# Patient Record
Sex: Female | Born: 1975 | Race: Black or African American | Hispanic: No | Marital: Married | Smoking: Former smoker
Health system: Southern US, Community
[De-identification: ages and names within clinical notes are randomized; demographics above are authoritative.]

## PROBLEM LIST (undated history)

## (undated) DIAGNOSIS — F329 Major depressive disorder, single episode, unspecified: Secondary | ICD-10-CM

## (undated) DIAGNOSIS — D649 Anemia, unspecified: Secondary | ICD-10-CM

## (undated) DIAGNOSIS — F32A Depression, unspecified: Secondary | ICD-10-CM

## (undated) DIAGNOSIS — F419 Anxiety disorder, unspecified: Secondary | ICD-10-CM

## (undated) HISTORY — PX: WISDOM TOOTH EXTRACTION: SHX21

## (undated) HISTORY — PX: FEMUR FRACTURE SURGERY: SHX633

## (undated) HISTORY — PX: ORIF TIBIA & FIBULA FRACTURES: SHX2131

---

## 1999-07-31 ENCOUNTER — Other Ambulatory Visit: Admission: RE | Admit: 1999-07-31 | Discharge: 1999-07-31 | Payer: Self-pay | Admitting: Obstetrics and Gynecology

## 2000-05-15 ENCOUNTER — Ambulatory Visit (HOSPITAL_COMMUNITY): Admission: RE | Admit: 2000-05-15 | Discharge: 2000-05-15 | Payer: Self-pay | Admitting: Obstetrics and Gynecology

## 2000-05-15 ENCOUNTER — Encounter: Payer: Self-pay | Admitting: Obstetrics and Gynecology

## 2000-10-21 ENCOUNTER — Encounter: Payer: Self-pay | Admitting: Obstetrics and Gynecology

## 2000-10-21 ENCOUNTER — Inpatient Hospital Stay (HOSPITAL_COMMUNITY): Admission: RE | Admit: 2000-10-21 | Discharge: 2000-10-26 | Payer: Self-pay | Admitting: Obstetrics and Gynecology

## 2000-10-21 ENCOUNTER — Encounter (INDEPENDENT_AMBULATORY_CARE_PROVIDER_SITE_OTHER): Payer: Self-pay | Admitting: Specialist

## 2000-10-27 ENCOUNTER — Encounter: Admission: RE | Admit: 2000-10-27 | Discharge: 2000-11-26 | Payer: Self-pay | Admitting: Obstetrics and Gynecology

## 2000-12-04 ENCOUNTER — Other Ambulatory Visit: Admission: RE | Admit: 2000-12-04 | Discharge: 2000-12-04 | Payer: Self-pay | Admitting: Obstetrics and Gynecology

## 2002-07-28 HISTORY — PX: ORIF TIBIA & FIBULA FRACTURES: SHX2131

## 2002-07-28 HISTORY — PX: FEMUR FRACTURE SURGERY: SHX633

## 2002-12-06 ENCOUNTER — Other Ambulatory Visit: Admission: RE | Admit: 2002-12-06 | Discharge: 2002-12-06 | Payer: Self-pay | Admitting: Obstetrics and Gynecology

## 2004-03-25 ENCOUNTER — Other Ambulatory Visit: Admission: RE | Admit: 2004-03-25 | Discharge: 2004-03-25 | Payer: Self-pay | Admitting: Obstetrics and Gynecology

## 2005-09-05 ENCOUNTER — Other Ambulatory Visit: Admission: RE | Admit: 2005-09-05 | Discharge: 2005-09-05 | Payer: Self-pay | Admitting: Obstetrics and Gynecology

## 2005-09-20 ENCOUNTER — Emergency Department (HOSPITAL_COMMUNITY): Admission: EM | Admit: 2005-09-20 | Discharge: 2005-09-21 | Payer: Self-pay | Admitting: Emergency Medicine

## 2005-09-26 ENCOUNTER — Ambulatory Visit: Payer: Self-pay | Admitting: Gastroenterology

## 2009-08-08 ENCOUNTER — Emergency Department (HOSPITAL_COMMUNITY): Admission: EM | Admit: 2009-08-08 | Discharge: 2009-08-09 | Payer: Self-pay | Admitting: Emergency Medicine

## 2010-05-17 ENCOUNTER — Emergency Department (HOSPITAL_COMMUNITY): Admission: EM | Admit: 2010-05-17 | Discharge: 2010-05-18 | Payer: Self-pay | Admitting: Emergency Medicine

## 2010-10-13 LAB — URINE MICROSCOPIC-ADD ON

## 2010-10-13 LAB — URINALYSIS, ROUTINE W REFLEX MICROSCOPIC
Bilirubin Urine: NEGATIVE
Glucose, UA: NEGATIVE mg/dL
Ketones, ur: NEGATIVE mg/dL
Leukocytes, UA: NEGATIVE
Nitrite: NEGATIVE
Protein, ur: NEGATIVE mg/dL
Specific Gravity, Urine: 1.031 — ABNORMAL HIGH (ref 1.005–1.030)
Urobilinogen, UA: 1 mg/dL (ref 0.0–1.0)
pH: 6 (ref 5.0–8.0)

## 2010-10-13 LAB — PREGNANCY, URINE: Preg Test, Ur: NEGATIVE

## 2010-12-13 NOTE — Discharge Summary (Signed)
[facility]  Patient:    Kelly Joyce, Kelly Joyce                      MRN: [id number] Adm. Date:  [id number] Disc. Date: 10/26/00 Attending:  Michaele Offer                           Discharge Summary  ADMISSION DIAGNOSES:          1. Intrauterine pregnancy at 41 weeks.                               2. Oligohydramnios.  DIAGNOSIS DIAGNOSES:          1. Intrauterine pregnancy at 41 weeks.                               2. Oligohydramnios.                               3. Thick meconium stained amniotic fluid.                               4. Nonreassuring fetal heart tracing.  PROCEDURE:                    Primary low transverse cesarean section without                               extensions.  COMPLICATIONS:                None.  CONSULTATIONS:                None.  HISTORY OF PRESENT ILLNESS:   This is a 35 year old black female gravida 3, para 1-0-1-1 with an EGA of [redacted] weeks by a 10-week ultrasound with an EDC of October 14, 2000, who was seen on the day of admission for testing due to going past her due date.  Ultrasound revealed an AFI of 1.7 and she was admitted for induction with an unfavorable cervix. Prenatal care was otherwise uncomplicated.  PRENATAL LABS:                Blood type is O positive with a negative antibody screen.  Sickle screen is negative.  RPR nonreactive.  Rubella immune.  Hepatitis B surface antigen negative.  HIV negative.  Gonorrhea and chlamydia negative. Triple screen normal. One hour Glucola was 79 and group B strep is negative.  PAST OB HISTORY:              In 1994, she had a vaginal delivery at term; 7 pounds 1 ounce.  In 1996, she had a spontaneous abortion.  The remainder of her history is noncontributory.  PHYSICAL EXAMINATION:  VITAL SIGNS:                  She was afebrile with stable vital signs.  ABDOMEN:                      Soft with a fundal height of 39 cm.  Fetal heart tracing was normal and  she was contracting every two to  four minutes.  PELVIC:                       Cervix was 160, -1, with vertex presentation and an adequate pelvis.  HOSPITAL COURSE:              The patient was admitted due to the oligohydramnios found on ultrasound and her contractions spaced out.  Thus, Malachi Pro. Ambrose Mantle, M.D., placed some Prostin gel for cervical ripening due to her unfavorable cervix.  Early on the morning of October 22, 2000, she was then started on Pitocin induction.  At that time fetal heart tracing was reassuring with a few variable decelerations and she was contracting every three to five minutes on Pitocin.  At approximately noon on October 22, 2000, the fetal heart tracing developed frequent variable decelerations, some of which were prolonged and there was slightly decreased variability.  Vaginal examination at that time was 6, 90, and 0 with a vertex presentation and an adequate pelvis. Artificial rupture of membranes at that time revealed thick dark stained meconium.  An  IUPC and FSC were placed and an amnioinfusion was begun.  Fetal heart tracing then developed possible late decelerations with decreased variability and no significant accelerations.  She has also had prolonged decelerations with contractions and we were unable to restart the Pitocin.  Her cervical examination also did not change over approximately an hour and a half.  At that time, it was recommended that she have a cesarean section and the patient agreed.  She was taken to the operating room and had a primary low transverse cesarean section without extensions performed under epidural anesthesia.  Estimated blood loss was 1000 cc and the patient had normal anatomy.  She delivered a viable female infant with Apgars of 7 and 9 that weighed 8 pounds 4 ounces.  Postoperatively, the patient did very well. She remained essentially afebrile and was rapidly able to ambulate and tolerate a regular diet.  Predelivery  hemoglobin 11.5, post delivery was 10.0. On the morning of postoperative day #4, she again was afebrile and her incision was healing well.  At this time her staples were removed and Steri-Strips were applied.  She was felt to be stable enough for discharge home at this time.  CONDITION ON DISCHARGE:       Stable.  DISPOSITION:                  Discharged to home.  DISCHARGE INSTRUCTIONS: 1. Her diet is a regular diet. 2. Activity is pelvic rest, no strenuous activity and no driving. 3. Follow-up is in 10 days for an incision check. 4. Medication is Percocet p.r.n. pain. 5. She is given a discharge pamphlet. DD:  10/26/00 TD:  10/26/00 Job: [id number]

## 2010-12-13 NOTE — Op Note (Signed)
[facility]  Patient:    Kelly Joyce, Kelly Joyce                      MRN: [id number] Proc. Date: 10/22/00 Adm. Date:  [id number] Attending:  Michaele Offer                           Operative Report  PREOPERATIVE DIAGNOSES:       1. Intrauterine pregnancy at 41 weeks.                               2. Oligohydramnios.                               3. Thick, meconium-stained amniotic fluid.                               4. Nonreassuring fetal heart tracing.  POSTOPERATIVE DIAGNOSES:      1. Intrauterine pregnancy at 41 weeks.                               2. Oligohydramnios.                               3. Thick, meconium-stained amniotic fluid.                               4. Nonreassuring fetal heart tracing.  OPERATION:                    Primary low transverse cesarean section                               without extensions.  SURGEON:                      Zenaida Niece, M.D.  ANESTHESIA:                   Epidural.  ESTIMATED BLOOD LOSS:         1000 cc.  DRUGS ADMINISTERED:           Chemotherapy prophylaxis, Ancef 1 g after cord clamp.  FINDINGS:                     Normal female anatomy and delivery of a viable female infant with Apgars of 7/9, weight 8 pounds 4 ounces.  COUNTS:                       Correct.  CONDITION:                    Stable.  PROCEDURE IN DETAIL:          After appropriate informed consent was obtained, the patient was taken to the operating room and placed in the dorsosupine position with left lateral tilt.  Her previously placed epidural was dosed appropriately.  Her abdomen was prepped and draped in the usual sterile fashion.  The level of her anesthesia was  found to be adequate, and her abdomen was entered via a standard Pfannenstiel incision.  The vesicouterine peritoneum was incised and a bladder flap created digitally.  The bladder blade was placed to retract the bladder, and a 4 cm transverse  incision was made in the lower uterine segment.  Moderate meconium fluid was noted upon entering the amniotic cavity, and the uterine incision was extended bilaterally digitally.  The fetal vertex was grasped and delivered through the incision atraumatically.  Nuchal cord x 1 was reduced.  The mouth and nares were DeLee suctioned with the return of a fair amount of moderate meconium. The remainder of the infant was then delivered atraumatically.  The cord was doubly clamped and cut and the infant handed to the awaiting pediatric team. Cord blood and a segment of cord for gas were obtained and the placenta delivered spontaneously.  The uterus was wiped dry with a clean lap pad and palpated normally.  The uterine incision was inspected and found to be free of extensions.  It was then closed in a running locking layer with #1 chromic with adequate hemostasis.  Both tubes and ovaries were inspected and found to be normal.  The uterine incision was again inspected and found to be hemostatic.  The subfascial space was irrigated and made hemostatic with electrocautery.  The fascia was closed in a running fashion starting at both ends and meeting in the middle with 0 Vicryl.  The subcutaneous tissue was irrigated and made hemostatic with electrocautery.  The skin was closed with staples followed by a sterile dressing.  The patient tolerated the procedure well and was taken to the recovery room in stable condition. DD:  10/22/00 TD:  10/23/00 Job: [id number]

## 2011-01-10 ENCOUNTER — Other Ambulatory Visit: Payer: Self-pay | Admitting: Allergy and Immunology

## 2011-01-10 ENCOUNTER — Ambulatory Visit
Admission: RE | Admit: 2011-01-10 | Discharge: 2011-01-10 | Disposition: A | Discharge status: Home / Self Care | Payer: BC Managed Care – PPO | Source: Ambulatory Visit | Attending: Allergy and Immunology | Admitting: Allergy and Immunology

## 2011-01-10 DIAGNOSIS — R0781 Pleurodynia: Secondary | ICD-10-CM

## 2011-02-25 ENCOUNTER — Other Ambulatory Visit: Payer: Self-pay | Admitting: Family Medicine

## 2011-02-25 ENCOUNTER — Other Ambulatory Visit (HOSPITAL_COMMUNITY)
Admission: RE | Admit: 2011-02-25 | Discharge: 2011-02-25 | Disposition: A | Discharge status: Home / Self Care | Payer: BC Managed Care – PPO | Source: Ambulatory Visit | Attending: Family Medicine | Admitting: Family Medicine

## 2011-02-25 DIAGNOSIS — Z124 Encounter for screening for malignant neoplasm of cervix: Secondary | ICD-10-CM | POA: Insufficient documentation

## 2011-06-04 ENCOUNTER — Telehealth: Payer: Self-pay | Admitting: Oncology

## 2011-06-04 NOTE — Telephone Encounter (Signed)
lmonvm for pt to call me re appt w/DM.

## 2011-06-06 ENCOUNTER — Telehealth: Payer: Self-pay | Admitting: Oncology

## 2011-06-06 NOTE — Telephone Encounter (Signed)
gv pt appt for 11/20 @ 2 pm w/DM.

## 2011-06-16 ENCOUNTER — Other Ambulatory Visit: Payer: Self-pay | Admitting: Oncology

## 2011-06-16 ENCOUNTER — Encounter (HOSPITAL_COMMUNITY): Payer: Self-pay | Admitting: *Deleted

## 2011-06-16 DIAGNOSIS — D509 Iron deficiency anemia, unspecified: Secondary | ICD-10-CM | POA: Insufficient documentation

## 2011-06-17 ENCOUNTER — Other Ambulatory Visit: Payer: Self-pay | Admitting: Oncology

## 2011-06-17 ENCOUNTER — Other Ambulatory Visit: Payer: Self-pay | Admitting: *Deleted

## 2011-06-17 ENCOUNTER — Ambulatory Visit (HOSPITAL_BASED_OUTPATIENT_CLINIC_OR_DEPARTMENT_OTHER): Payer: BC Managed Care – PPO | Admitting: Oncology

## 2011-06-17 ENCOUNTER — Ambulatory Visit: Payer: BC Managed Care – PPO

## 2011-06-17 ENCOUNTER — Other Ambulatory Visit (HOSPITAL_BASED_OUTPATIENT_CLINIC_OR_DEPARTMENT_OTHER): Payer: BC Managed Care – PPO | Admitting: Lab

## 2011-06-17 VITALS — BP 121/85 | HR 79 | Temp 98.5°F | Ht 65.0 in | Wt 141.7 lb

## 2011-06-17 DIAGNOSIS — D709 Neutropenia, unspecified: Secondary | ICD-10-CM

## 2011-06-17 DIAGNOSIS — D509 Iron deficiency anemia, unspecified: Secondary | ICD-10-CM

## 2011-06-17 DIAGNOSIS — D72819 Decreased white blood cell count, unspecified: Secondary | ICD-10-CM

## 2011-06-17 DIAGNOSIS — F17200 Nicotine dependence, unspecified, uncomplicated: Secondary | ICD-10-CM

## 2011-06-17 LAB — CBC & DIFF AND RETIC
Basophils Absolute: 0 10*3/uL (ref 0.0–0.1)
Eosinophils Absolute: 0.1 10*3/uL (ref 0.0–0.5)
HCT: 36.1 % (ref 34.8–46.6)
HGB: 11.9 g/dL (ref 11.6–15.9)
LYMPH%: 43.6 % (ref 14.0–49.7)
MCH: 28.7 pg (ref 25.1–34.0)
MCV: 87 fL (ref 79.5–101.0)
MONO%: 11 % (ref 0.0–14.0)
NEUT#: 1.3 10*3/uL — ABNORMAL LOW (ref 1.5–6.5)
NEUT%: 40.8 % (ref 38.4–76.8)
Platelets: 268 10*3/uL (ref 145–400)
RDW: 14.7 % — ABNORMAL HIGH (ref 11.2–14.5)
Retic Ct Abs: 42.75 10*3/uL (ref 33.70–90.70)

## 2011-06-17 LAB — CHCC SMEAR

## 2011-06-17 LAB — COMPREHENSIVE METABOLIC PANEL
AST: 13 U/L (ref 0–37)
Albumin: 4.4 g/dL (ref 3.5–5.2)
Alkaline Phosphatase: 54 U/L (ref 39–117)
BUN: 10 mg/dL (ref 6–23)
Glucose, Bld: 81 mg/dL (ref 70–99)
Potassium: 4.1 mEq/L (ref 3.5–5.3)
Sodium: 139 mEq/L (ref 135–145)
Total Bilirubin: 0.5 mg/dL (ref 0.3–1.2)

## 2011-06-17 LAB — IRON AND TIBC
%SAT: 23 % (ref 20–55)
Iron: 91 ug/dL (ref 42–145)
TIBC: 390 ug/dL (ref 250–470)
UIBC: 299 ug/dL (ref 125–400)

## 2011-06-17 LAB — FERRITIN: Ferritin: 18 ng/mL (ref 10–291)
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
## 2011-06-17 NOTE — Progress Notes (Signed)
This office note has been dictated.  #[id number]

## 2011-06-24 ENCOUNTER — Encounter (HOSPITAL_COMMUNITY): Payer: Self-pay | Admitting: Pharmacist
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
## 2011-07-07 ENCOUNTER — Encounter (HOSPITAL_COMMUNITY): Payer: Self-pay | Admitting: Anesthesiology

## 2011-07-07 ENCOUNTER — Encounter (HOSPITAL_COMMUNITY): Payer: Self-pay | Admitting: *Deleted

## 2011-07-07 ENCOUNTER — Ambulatory Visit (HOSPITAL_COMMUNITY): Payer: BC Managed Care – PPO | Admitting: Anesthesiology

## 2011-07-07 ENCOUNTER — Encounter (HOSPITAL_COMMUNITY)
Admission: RE | Disposition: A | Discharge status: Home / Self Care | Payer: Self-pay | Source: Ambulatory Visit | Attending: Obstetrics and Gynecology

## 2011-07-07 ENCOUNTER — Ambulatory Visit (HOSPITAL_COMMUNITY)
Admission: RE | Admit: 2011-07-07 | Discharge: 2011-07-07 | Disposition: A | Discharge status: Home / Self Care | Payer: BC Managed Care – PPO | Source: Ambulatory Visit | Attending: Obstetrics and Gynecology | Admitting: Obstetrics and Gynecology

## 2011-07-07 DIAGNOSIS — N920 Excessive and frequent menstruation with regular cycle: Secondary | ICD-10-CM | POA: Insufficient documentation

## 2011-07-07 DIAGNOSIS — D250 Submucous leiomyoma of uterus: Secondary | ICD-10-CM | POA: Diagnosis present

## 2011-07-07 HISTORY — DX: Anemia, unspecified: D64.9

## 2011-07-07 LAB — BASIC METABOLIC PANEL
CO2: 27 mEq/L (ref 19–32)
Calcium: 8.9 mg/dL (ref 8.4–10.5)
Creatinine, Ser: 0.66 mg/dL (ref 0.50–1.10)
GFR calc Af Amer: >90 mL/min (ref >90)
GFR calc non Af Amer: >90 mL/min (ref >90)
Sodium: 136 mEq/L (ref 135–145)

## 2011-07-07 LAB — CBC
MCH: 29 pg (ref 26.0–34.0)
MCV: 90 fL (ref 78.0–100.0)
Platelets: 256 10*3/uL (ref 150–400)
RBC: 3.89 MIL/uL (ref 3.87–5.11)
RDW: 14.3 % (ref 11.5–15.5)
WBC: 4.4 10*3/uL (ref 4.0–10.5)

## 2011-07-07 LAB — PREGNANCY, URINE: Preg Test, Ur: NEGATIVE

## 2011-07-07 SURGERY — DILATATION & CURETTAGE/HYSTEROSCOPY WITH RESECTOCOPE
Anesthesia: Choice

## 2011-07-07 MED ORDER — FENTANYL CITRATE 0.05 MG/ML IJ SOLN
INTRAMUSCULAR | Status: AC
  Filled 2011-07-07: qty 5

## 2011-07-07 MED ORDER — PROPOFOL 10 MG/ML IV EMUL
INTRAVENOUS | Status: DC | PRN
  Administered 2011-07-07: 200 mg via INTRAVENOUS

## 2011-07-07 MED ORDER — PROPOFOL 10 MG/ML IV EMUL
INTRAVENOUS | Status: AC
  Filled 2011-07-07: qty 20

## 2011-07-07 MED ORDER — ACETAMINOPHEN 325 MG PO TABS: 325.0000 mg | ORAL_TABLET | ORAL | Status: DC | PRN

## 2011-07-07 MED ORDER — MIDAZOLAM HCL 2 MG/2ML IJ SOLN
INTRAMUSCULAR | Status: AC
  Filled 2011-07-07: qty 2

## 2011-07-07 MED ORDER — LACTATED RINGERS IV SOLN
INTRAVENOUS | Status: DC
  Administered 2011-07-07: 07:00:00 via INTRAVENOUS

## 2011-07-07 MED ORDER — LIDOCAINE HCL (CARDIAC) 20 MG/ML IV SOLN
INTRAVENOUS | Status: DC | PRN
  Administered 2011-07-07: 40 mg via INTRAVENOUS

## 2011-07-07 MED ORDER — KETOROLAC TROMETHAMINE 30 MG/ML IJ SOLN
INTRAMUSCULAR | Status: DC | PRN
  Administered 2011-07-07: 30 mg via INTRAVENOUS

## 2011-07-07 MED ORDER — ONDANSETRON HCL 4 MG/2ML IJ SOLN
INTRAMUSCULAR | Status: AC
  Filled 2011-07-07: qty 2

## 2011-07-07 MED ORDER — FENTANYL CITRATE 0.05 MG/ML IJ SOLN
INTRAMUSCULAR | Status: DC | PRN
  Administered 2011-07-07: 100 ug via INTRAVENOUS
  Administered 2011-07-07: 50 ug via INTRAVENOUS

## 2011-07-07 MED ORDER — ONDANSETRON HCL 4 MG/2ML IJ SOLN
INTRAMUSCULAR | Status: DC | PRN
  Administered 2011-07-07: 4 mg via INTRAVENOUS

## 2011-07-07 MED ORDER — FENTANYL CITRATE 0.05 MG/ML IJ SOLN: 25.0000 ug | INTRAMUSCULAR | Status: DC | PRN

## 2011-07-07 MED ORDER — DEXAMETHASONE SODIUM PHOSPHATE 10 MG/ML IJ SOLN
INTRAMUSCULAR | Status: AC
  Filled 2011-07-07: qty 1

## 2011-07-07 MED ORDER — MIDAZOLAM HCL 5 MG/5ML IJ SOLN
INTRAMUSCULAR | Status: DC | PRN
  Administered 2011-07-07: 2 mg via INTRAVENOUS

## 2011-07-07 MED ORDER — PROMETHAZINE HCL 25 MG/ML IJ SOLN: 6.2500 mg | INTRAMUSCULAR | Status: DC | PRN

## 2011-07-07 MED ORDER — LIDOCAINE HCL 2 % IJ SOLN
INTRAMUSCULAR | Status: DC | PRN
  Administered 2011-07-07: 16 mL

## 2011-07-07 MED ORDER — DEXAMETHASONE SODIUM PHOSPHATE 10 MG/ML IJ SOLN
INTRAMUSCULAR | Status: DC | PRN
  Administered 2011-07-07: 10 mg via INTRAVENOUS

## 2011-07-07 MED ORDER — KETOROLAC TROMETHAMINE 30 MG/ML IJ SOLN: 15.0000 mg | Freq: Once | INTRAMUSCULAR | Status: DC | PRN

## 2011-07-07 MED ORDER — LIDOCAINE HCL (CARDIAC) 20 MG/ML IV SOLN
INTRAVENOUS | Status: AC
  Filled 2011-07-07: qty 5

## 2011-07-07 MED ORDER — KETOROLAC TROMETHAMINE 30 MG/ML IJ SOLN
INTRAMUSCULAR | Status: AC
  Filled 2011-07-07: qty 1

## 2011-07-07 SURGICAL SUPPLY — 15 items
ABLATOR ENDOMETRIAL BIPOLAR (ABLATOR) IMPLANT
CANISTER SUCTION 2500CC (MISCELLANEOUS) ×2 IMPLANT
CATH ROBINSON RED A/P 16FR (CATHETERS) ×2 IMPLANT
CLOTH BEACON ORANGE TIMEOUT ST (SAFETY) ×2 IMPLANT
CONTAINER PREFILL 10% NBF 60ML (FORM) ×4 IMPLANT
ELECT REM PT RETURN 9FT ADLT (ELECTROSURGICAL) ×2
ELECTRODE REM PT RTRN 9FT ADLT (ELECTROSURGICAL) ×1 IMPLANT
GLOVE BIO SURGEON STRL SZ8 (GLOVE) ×2 IMPLANT
GLOVE ORTHO TXT STRL SZ7.5 (GLOVE) ×2 IMPLANT
GOWN PREVENTION PLUS LG XLONG (DISPOSABLE) ×2 IMPLANT
GOWN STRL REIN XL XLG (GOWN DISPOSABLE) ×2 IMPLANT
LOOP ANGLED CUTTING 22FR (CUTTING LOOP) IMPLANT
PACK HYSTEROSCOPY LF (CUSTOM PROCEDURE TRAY) ×2 IMPLANT
TOWEL OR 17X24 6PK STRL BLUE (TOWEL DISPOSABLE) ×4 IMPLANT
WATER STERILE IRR 1000ML POUR (IV SOLUTION) ×2 IMPLANT

## 2011-07-07 NOTE — Progress Notes (Signed)
Date of Initial H&P: 07-06-11  History reviewed, patient examined, no change in status, stable for surgery.  Reviewed procedure and consent.

## 2011-07-07 NOTE — Anesthesia Preprocedure Evaluation (Signed)
Anesthesia Evaluation  Patient identified by MRN, date of birth, ID band Patient awake    Reviewed: Allergy & Precautions, H&P , Patient's Chart, lab work & pertinent test results, reviewed documented beta blocker date and time   History of Anesthesia Complications Negative for: history of anesthetic complications  Airway Mallampati: II TM Distance: >3 FB Neck ROM: full    Dental No notable dental hx.    Pulmonary neg pulmonary ROS,  clear to auscultation  Pulmonary exam normal       Cardiovascular Exercise Tolerance: Good neg cardio ROS regular Normal    Neuro/Psych Negative Neurological ROS  Negative Psych ROS   GI/Hepatic negative GI ROS, Neg liver ROS,   Endo/Other  Negative Endocrine ROS  Renal/GU negative Renal ROS     Musculoskeletal   Abdominal   Peds  Hematology negative hematology ROS (+)   Anesthesia Other Findings   Reproductive/Obstetrics negative OB ROS                           Anesthesia Physical Anesthesia Plan  ASA: I  Anesthesia Plan: General LMA   Post-op Pain Management:    Induction:   Airway Management Planned:   Additional Equipment:   Intra-op Plan:   Post-operative Plan:   Informed Consent: I have reviewed the patients History and Physical, chart, labs and discussed the procedure including the risks, benefits and alternatives for the proposed anesthesia with the patient or authorized representative who has indicated his/her understanding and acceptance.   Dental Advisory Given  Plan Discussed with: CRNA and Surgeon  Anesthesia Plan Comments:         Anesthesia Quick Evaluation

## 2011-07-07 NOTE — Op Note (Signed)
Preoperative diagnosis: Intracavitary submucosal fibroid Postop diagnosis: Same Procedure: Hysteroscopy and resection of the fibroid using the Mayeaux sure device Surgeon: Lavina Hamman M.D. Anesthesia: Gen. With an LMA, paracervical block Findings: She had a 2 cm intracavitary fibroid coming from the right anterior lateral fundus. No other endometrial lesions. Resection with the Myosure device went well without difficulty. Specimens: Endometrial resections not sent for pathology Estimated blood loss: Minimal Fluid deficit through the hysteroscope: Minimal Complications: None  Procedure in detail: The patient was taken to the operating room and placed in the dorsosupine position. General anesthesia was induced and she was placed in mobile stirrups. Perineum and vagina were then prepped and draped in the usual sterile fashion, bladder drained with a red Robinson catheter. A Graves speculum was inserted into the vagina and the anterior lip of the cervix was grasped with a single-tooth tenaculum. Paracervical block was then performed with a total of 16 cc of 2% plain lidocaine. Uterus then sounded to 10 cm. The cervix was gradually easily dilated to a size 19 dilator. The Myosure scope was inserted and good visualization was achieved. This fibroid was easily visualized and no other endometrial pathology was identified. A resection was then performed with the Myosure device without any difficulty removing the fibroid in its entirety. No significant bleeding was noted. The scope was removed. The single-tooth tenaculum was removed and bleeding was controlled with pressure. All instruments were then removed from the vagina. The patient tolerated the procedure well and was taken to the recovery in stable condition. Counts were correct and she had PAS hose on throughout the procedure.

## 2011-07-07 NOTE — Transfer of Care (Signed)
Immediate Anesthesia Transfer of Care Note  Patient: Kelly Joyce  Procedure(s) Performed:  DILATATION & CURETTAGE/HYSTEROSCOPY WITH RESECTOSCOPE - Myosure/resection of a myoma  Patient Location: PACU  Anesthesia Type: General  Level of Consciousness: awake, alert  and oriented  Airway & Oxygen Therapy: Patient Spontanous Breathing and Patient connected to nasal cannula oxygen  Post-op Assessment: Report given to PACU RN and Post -op Vital signs reviewed and stable  Post vital signs: Reviewed and stable  Complications: No apparent anesthesia complications

## 2011-07-08 NOTE — Anesthesia Postprocedure Evaluation (Signed)
  Anesthesia Post-op Note  Patient: Kelly Joyce  Procedure(s) Performed:  DILATATION & CURETTAGE/HYSTEROSCOPY WITH RESECTOSCOPE - Myosure/resection of a myoma  Patient is awake and responsive. Pain and nausea are reasonably well controlled. Vital signs are stable and clinically acceptable. Oxygen saturation is clinically acceptable. There are no apparent anesthetic complications at this time. Patient is ready for discharge.

## 2012-11-26 ENCOUNTER — Other Ambulatory Visit: Payer: Self-pay

## 2012-11-26 DIAGNOSIS — Z1231 Encounter for screening mammogram for malignant neoplasm of breast: Secondary | ICD-10-CM

## 2012-12-11 IMAGING — CR DG RIBS W/ CHEST 3+V*R*
3 series · 3 of 3 positions shown · non-contrast
Comparison: None.

CLINICAL DATA: Right posterior inferior rib pain.  Intermittent
shortness of breath.  Smoker.

RIGHT RIBS AND CHEST - 3+ VIEW

[w chest pa]
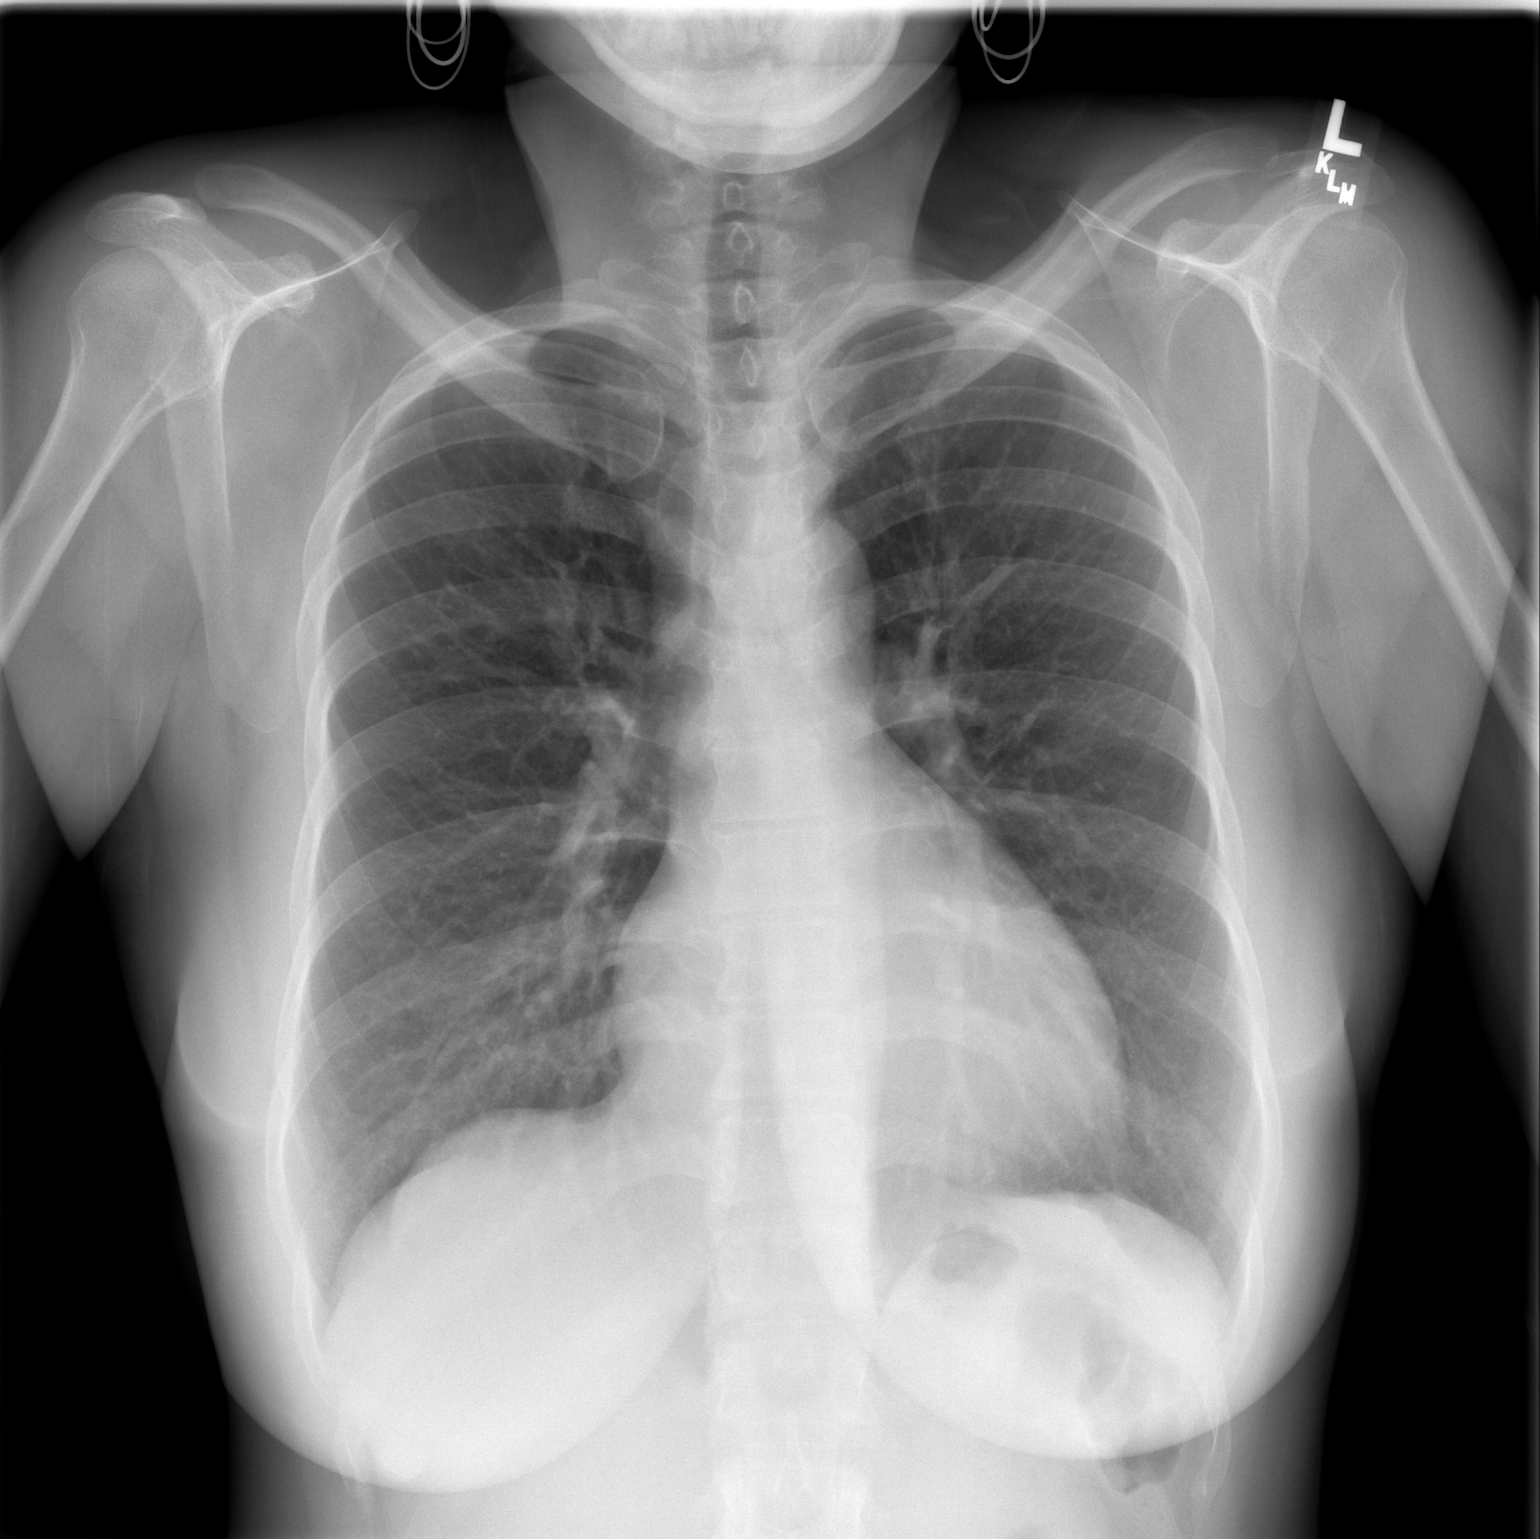

[w ribs ap/pa lower right]
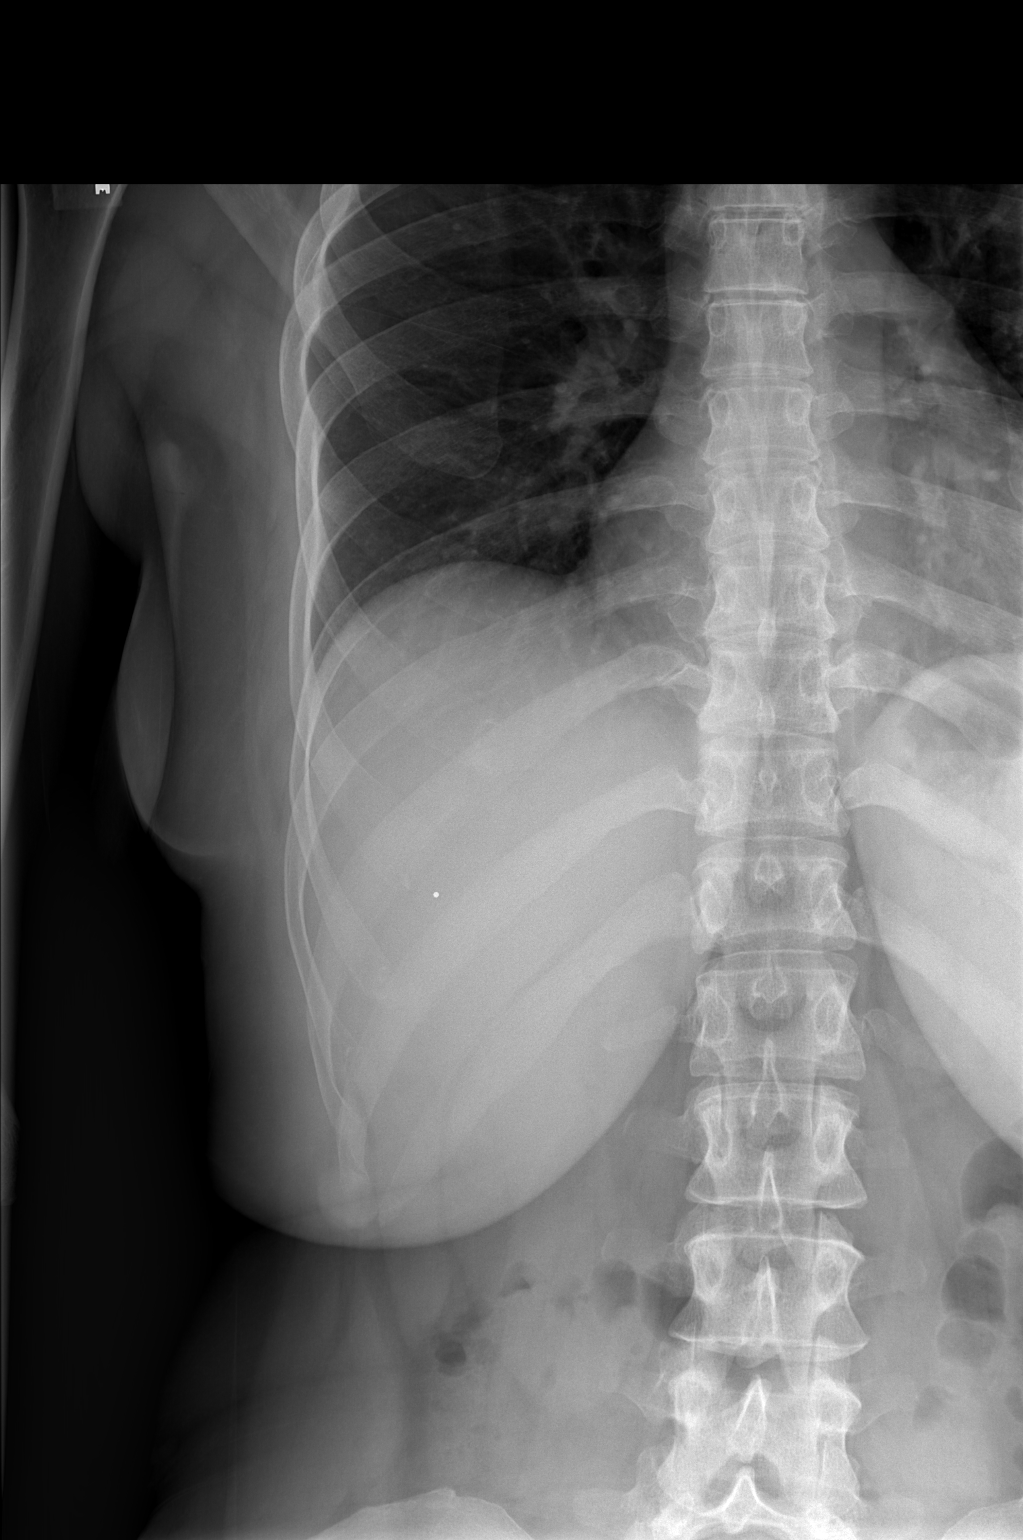

[w ribs oblique right]
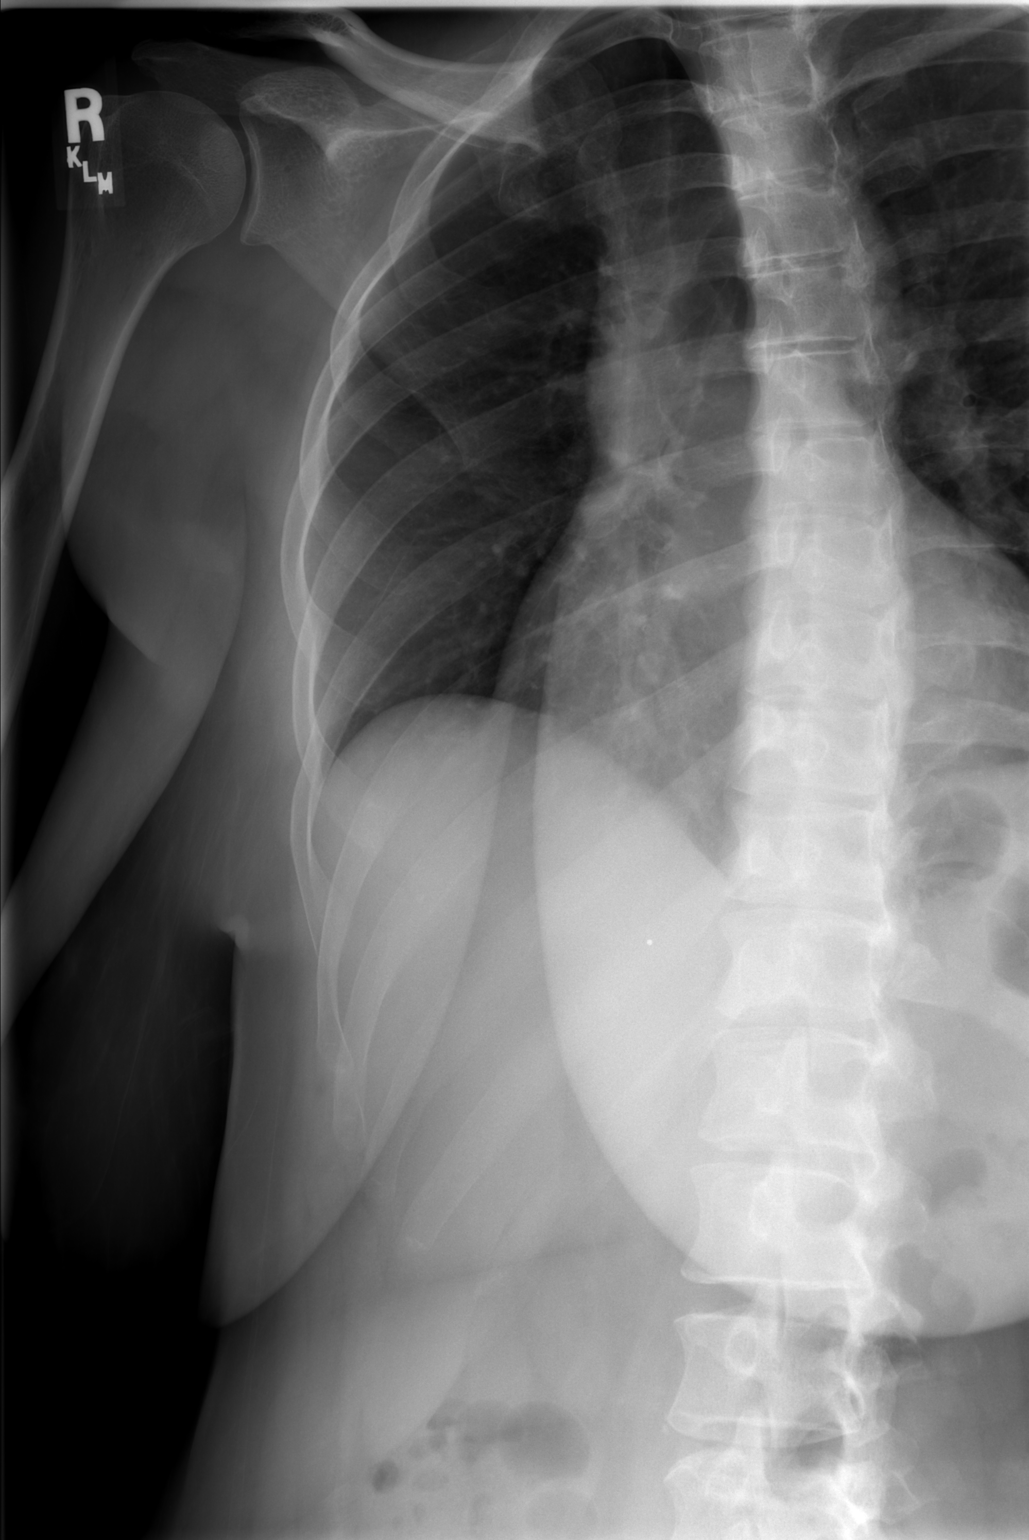

[3 of 3 positions shown; findings below may reference images not displayed]

FINDINGS: Lungs are clear.

Heart size upper normal.

Mediastinum, hila, pleura appear normal.

Region of maximum symptoms marked with surface BB.

No rib fracture or lesions seen.
IMPRESSION: Negative.

## 2012-12-29 ENCOUNTER — Emergency Department (HOSPITAL_COMMUNITY)
Admission: EM | Admit: 2012-12-29 | Discharge: 2012-12-30 | Disposition: A | Discharge status: Home / Self Care | Payer: BC Managed Care – PPO | Attending: Emergency Medicine | Admitting: Emergency Medicine

## 2012-12-29 ENCOUNTER — Encounter (HOSPITAL_COMMUNITY): Payer: Self-pay | Admitting: Emergency Medicine

## 2012-12-29 DIAGNOSIS — IMO0002 Reserved for concepts with insufficient information to code with codable children: Secondary | ICD-10-CM

## 2012-12-29 DIAGNOSIS — Z862 Personal history of diseases of the blood and blood-forming organs and certain disorders involving the immune mechanism: Secondary | ICD-10-CM | POA: Insufficient documentation

## 2012-12-29 DIAGNOSIS — S71109A Unspecified open wound, unspecified thigh, initial encounter: Secondary | ICD-10-CM | POA: Insufficient documentation

## 2012-12-29 DIAGNOSIS — W268XXA Contact with other sharp object(s), not elsewhere classified, initial encounter: Secondary | ICD-10-CM | POA: Insufficient documentation

## 2012-12-29 DIAGNOSIS — Y939 Activity, unspecified: Secondary | ICD-10-CM | POA: Insufficient documentation

## 2012-12-29 DIAGNOSIS — Z23 Encounter for immunization: Secondary | ICD-10-CM | POA: Insufficient documentation

## 2012-12-29 DIAGNOSIS — S71009A Unspecified open wound, unspecified hip, initial encounter: Secondary | ICD-10-CM | POA: Insufficient documentation

## 2012-12-29 DIAGNOSIS — Y92009 Unspecified place in unspecified non-institutional (private) residence as the place of occurrence of the external cause: Secondary | ICD-10-CM | POA: Insufficient documentation

## 2012-12-29 DIAGNOSIS — Z87891 Personal history of nicotine dependence: Secondary | ICD-10-CM | POA: Insufficient documentation

## 2012-12-29 DIAGNOSIS — S81009A Unspecified open wound, unspecified knee, initial encounter: Secondary | ICD-10-CM | POA: Insufficient documentation

## 2012-12-29 NOTE — ED Notes (Signed)
Pt states she knocked over a vase at home and it broke.  C/o approx 2 inch laceration to R lateral leg (near knee),  approx 3 inch laceration to L medial leg (near knee), and approx 1 cm laceration to R upper arm.  Pt tearful.  Last DT > 10 yrs

## 2012-12-30 MED ORDER — TETANUS-DIPHTH-ACELL PERTUSSIS 5-2.5-18.5 LF-MCG/0.5 IM SUSP
0.5000 mL | Freq: Once | INTRAMUSCULAR | Status: AC
  Administered 2012-12-30: 0.5 mL via INTRAMUSCULAR
  Filled 2012-12-30: qty 0.5

## 2012-12-30 MED ORDER — DIAZEPAM 5 MG PO TABS
5.0000 mg | ORAL_TABLET | Freq: Once | ORAL | Status: DC
  Filled 2012-12-30: qty 1

## 2012-12-30 NOTE — ED Provider Notes (Signed)
History     CSN: [id number]  Arrival date & time 12/29/12  2345   First MD Initiated Contact with Patient 12/30/12 0013      Chief Complaint  Patient presents with  . Extremity Laceration    (Consider location/radiation/quality/duration/timing/severity/associated sxs/prior treatment) Patient is a 37 y.o. female presenting with skin laceration. The history is provided by the patient.  Laceration Location:  Leg Leg laceration location:  left and right thigh  Length (cm):  3 sm left, 2 cm right Depth:  Cutaneous Quality: straight   Bleeding: controlled   Time since incident: just prior to arrival  Laceration mechanism:  Broken glass Pain details:    Quality:  Aching   Severity:  Mild   Timing:  Constant Foreign body present:  No foreign bodies Worsened by:  Nothing tried Ineffective treatments:  None tried Tetanus status:  Out of date   Past Medical History  Diagnosis Date  . Anemia     Past Surgical History  Procedure Laterality Date  . Orif tibia & fibula fractures    . Femur fracture surgery    . Cesarean section      No family history on file.  History  Substance Use Topics  . Smoking status: Former Smoker -- 0.50 packs/day    Quit date: 04/30/2012  . Smokeless tobacco: Not on file  . Alcohol Use: 0.6 oz/week    1 Glasses of wine per week    OB History   Grav Para Term Preterm Abortions TAB SAB Ect Mult Living                  Review of Systems  All other systems reviewed and are negative.    Allergies  Advil and Mirena  Home Medications  No current outpatient prescriptions on file.  BP 117/74  Pulse 90  Temp(Src) 98.6 F (37 C) (Oral)  Resp 18  SpO2 98%  LMP 12/26/2012  Physical Exam  Nursing note and vitals reviewed. Constitutional: She is oriented to person, place, and time. She appears well-developed and well-nourished. No distress.  HENT:  Head: Normocephalic and atraumatic.  Eyes: Conjunctivae and EOM are normal.  Neck:  Normal range of motion.  Pulmonary/Chest: Effort normal.  Musculoskeletal: Normal range of motion.  Neurological: She is alert and oriented to person, place, and time.  Skin: Skin is warm and dry. No rash noted. She is not diaphoretic.  3 cm laceration on left medial thigh, 2 cm on right lateral knee. Both superficial w controlled bleeding.   Psychiatric: She has a normal mood and affect. Her behavior is normal.    ED Course  Procedures (including critical care time)  Labs Reviewed - No data to display No results found. LACERATION REPAIR x 2  Performed by: Jaci Carrel Authorized by: Jaci Carrel Consent: Verbal consent obtained. Risks and benefits: risks, benefits and alternatives were discussed Consent given by: patient Patient identity confirmed: provided demographic data Prepped and Draped in normal sterile fashion Wound explored  Laceration Location: 1. Left leg medial thigh                                   2. Right leg lateral knee  Laceration Length: 1. 3cm                                2. 2cm  No Foreign Bodies seen or palpated  Anesthesia: local infiltration  Local anesthetic: lidocaine 2% w epinephrine  Anesthetic total: 3 ml  Irrigation method: syringe Amount of cleaning: standard  Skin closure: 1 & 2   4.0 Prolene  Number of sutures: 1 x 2                                 Technique: interlocking   Patient tolerance: Patient tolerated the procedure well with no immediate complications.   No diagnosis found.    MDM  Tdap booster given.Pressure irrigation performed. Lacerations occurred < 8 hours prior to repair which was well tolerated. Pt has no co morbidities to effect normal wound healing. Discussed suture home care w pt and answered questions. Pt to f-u for wound check and suture removal in 7-10 days. Pt is hemodynamically stable w no complaints prior to dc.           Jaci Carrel, [place] 12/30/12 [phone]

## 2012-12-30 NOTE — ED Notes (Signed)
Lacerations cleaned out with sterile saline.

## 2012-12-31 NOTE — ED Provider Notes (Signed)
Medical screening examination/treatment/procedure(s) were performed by non-physician practitioner and as supervising physician I was immediately available for consultation/collaboration.   Laray Anger, DO 12/31/12 1441

## 2013-01-03 ENCOUNTER — Ambulatory Visit
Admission: RE | Admit: 2013-01-03 | Discharge: 2013-01-03 | Disposition: A | Discharge status: Home / Self Care | Payer: BC Managed Care – PPO | Source: Ambulatory Visit

## 2013-01-03 DIAGNOSIS — Z1231 Encounter for screening mammogram for malignant neoplasm of breast: Secondary | ICD-10-CM

## 2013-01-05 ENCOUNTER — Other Ambulatory Visit: Payer: Self-pay | Admitting: Family Medicine

## 2013-01-05 DIAGNOSIS — R928 Other abnormal and inconclusive findings on diagnostic imaging of breast: Secondary | ICD-10-CM

## 2013-01-09 ENCOUNTER — Encounter (HOSPITAL_COMMUNITY): Payer: Self-pay | Admitting: *Deleted

## 2013-01-09 ENCOUNTER — Emergency Department (HOSPITAL_COMMUNITY)
Admission: EM | Admit: 2013-01-09 | Discharge: 2013-01-09 | Disposition: A | Discharge status: Home / Self Care | Payer: BC Managed Care – PPO | Attending: Emergency Medicine | Admitting: Emergency Medicine

## 2013-01-09 DIAGNOSIS — Z4802 Encounter for removal of sutures: Secondary | ICD-10-CM | POA: Insufficient documentation

## 2013-01-09 DIAGNOSIS — Z862 Personal history of diseases of the blood and blood-forming organs and certain disorders involving the immune mechanism: Secondary | ICD-10-CM | POA: Insufficient documentation

## 2013-01-09 DIAGNOSIS — Z87891 Personal history of nicotine dependence: Secondary | ICD-10-CM | POA: Insufficient documentation

## 2013-01-09 NOTE — ED Provider Notes (Signed)
History     CSN: [id number]  Arrival date & time 01/09/13  1809   None     Chief Complaint  Patient presents with  . Suture / Staple Removal    (Consider location/radiation/quality/duration/timing/severity/associated sxs/prior treatment) HPI Comments: Patient presents to the ED for suture removal.  Wounds to left medial thigh and right posterior thigh repaired on 12/29/12 without complications.  Tetanus updated at that time.  Pt denies any drainage from wound and notes they are healing well.  No recent fevers, sweats, or chills.  Patient is a 37 y.o. female presenting with suture removal. The history is provided by the patient.  Suture / Staple Removal    Past Medical History  Diagnosis Date  . Anemia     Past Surgical History  Procedure Laterality Date  . Orif tibia & fibula fractures    . Femur fracture surgery    . Cesarean section      No family history on file.  History  Substance Use Topics  . Smoking status: Former Smoker -- 0.50 packs/day    Quit date: 04/30/2012  . Smokeless tobacco: Not on file  . Alcohol Use: 0.6 oz/week    1 Glasses of wine per week    OB History   Grav Para Term Preterm Abortions TAB SAB Ect Mult Living                  Review of Systems  Skin: Positive for wound.  All other systems reviewed and are negative.    Allergies  Advil and Mirena  Home Medications  No current outpatient prescriptions on file.  BP 131/84  Pulse 69  Temp(Src) 98.4 F (36.9 C)  Resp 18  SpO2 99%  LMP 01/01/2013  Physical Exam  Nursing note and vitals reviewed. Constitutional: She is oriented to person, place, and time. She appears well-developed and well-nourished.  HENT:  Head: Normocephalic and atraumatic.  Eyes: Conjunctivae and EOM are normal.  Neck: Normal range of motion. Neck supple.  Cardiovascular: Normal rate, regular rhythm and normal heart sounds.   Pulmonary/Chest: Effort normal and breath sounds normal.  Musculoskeletal:  Normal range of motion.  Interlocking sutures of left medial thigh and right posterior thigh in place, skin well approximated, no erythema, swelling, drainage, or signs of infection  Neurological: She is alert and oriented to person, place, and time.  Skin: Skin is warm and dry.  Psychiatric: She has a normal mood and affect.    ED Course  SUTURE REMOVAL Date/Time: 01/09/2013 9:08 PM Performed by: Garlon Hatchet Authorized by: Garlon Hatchet Consent: Verbal consent obtained. Patient identity confirmed: verbally with patient Body area: lower extremity (left medial thigh, right posterior thigh) Wound Appearance: clean Sutures Removed: 2 Facility: sutures placed in this facility Comments: Skin well approximated without surrounding erythema, swelling, drainage or signs of infection.  Sutures removed without difficulty.  Pt tolerated procedure well.   (including critical care time)  Labs Reviewed - No data to display No results found.   1. Visit for suture removal       MDM   Wound healed well without signs of infection.  Sutures removed as above- pt tolerated procedure well.  Return to ED for new concerns.       Garlon Hatchet, PA-C 01/09/13 2226

## 2013-01-09 NOTE — ED Notes (Signed)
The pt is here for suture removal from both thighs.  Well healed minimal redness

## 2013-01-10 ENCOUNTER — Ambulatory Visit
Admission: RE | Admit: 2013-01-10 | Discharge: 2013-01-10 | Disposition: A | Discharge status: Home / Self Care | Payer: BC Managed Care – PPO | Source: Ambulatory Visit | Attending: Family Medicine | Admitting: Family Medicine

## 2013-01-10 ENCOUNTER — Other Ambulatory Visit: Payer: Self-pay | Admitting: Family Medicine

## 2013-01-10 DIAGNOSIS — R928 Other abnormal and inconclusive findings on diagnostic imaging of breast: Secondary | ICD-10-CM

## 2013-01-10 NOTE — ED Provider Notes (Signed)
Medical screening examination/treatment/procedure(s) were performed by non-physician practitioner and as supervising physician I was immediately available for consultation/collaboration.  Sheresa Cullop, MD 01/10/13 0012 

## 2013-01-17 ENCOUNTER — Ambulatory Visit
Admission: RE | Admit: 2013-01-17 | Discharge: 2013-01-17 | Disposition: A | Discharge status: Home / Self Care | Payer: BC Managed Care – PPO | Source: Ambulatory Visit | Attending: Family Medicine | Admitting: Family Medicine

## 2013-01-17 ENCOUNTER — Other Ambulatory Visit: Payer: Self-pay | Admitting: Family Medicine

## 2013-01-17 DIAGNOSIS — R928 Other abnormal and inconclusive findings on diagnostic imaging of breast: Secondary | ICD-10-CM

## 2013-06-02 ENCOUNTER — Other Ambulatory Visit: Payer: Self-pay | Admitting: Family Medicine

## 2013-06-02 ENCOUNTER — Ambulatory Visit
Admission: RE | Admit: 2013-06-02 | Discharge: 2013-06-02 | Disposition: A | Discharge status: Home / Self Care | Payer: BC Managed Care – PPO | Source: Ambulatory Visit | Attending: Family Medicine | Admitting: Family Medicine

## 2013-06-02 DIAGNOSIS — M542 Cervicalgia: Secondary | ICD-10-CM

## 2014-05-23 ENCOUNTER — Other Ambulatory Visit: Payer: Self-pay | Admitting: Dermatology

## 2014-11-18 ENCOUNTER — Emergency Department (HOSPITAL_COMMUNITY)
Admission: EM | Admit: 2014-11-18 | Discharge: 2014-11-18 | Disposition: A | Discharge status: Home / Self Care | Payer: BC Managed Care – PPO | Attending: Emergency Medicine | Admitting: Emergency Medicine

## 2014-11-18 ENCOUNTER — Encounter (HOSPITAL_COMMUNITY): Payer: Self-pay

## 2014-11-18 DIAGNOSIS — F419 Anxiety disorder, unspecified: Secondary | ICD-10-CM | POA: Diagnosis present

## 2014-11-18 DIAGNOSIS — Z79899 Other long term (current) drug therapy: Secondary | ICD-10-CM | POA: Diagnosis not present

## 2014-11-18 DIAGNOSIS — Z862 Personal history of diseases of the blood and blood-forming organs and certain disorders involving the immune mechanism: Secondary | ICD-10-CM | POA: Insufficient documentation

## 2014-11-18 DIAGNOSIS — G4700 Insomnia, unspecified: Secondary | ICD-10-CM | POA: Insufficient documentation

## 2014-11-18 DIAGNOSIS — Z87891 Personal history of nicotine dependence: Secondary | ICD-10-CM | POA: Insufficient documentation

## 2014-11-18 LAB — CBC WITH DIFFERENTIAL/PLATELET
Basophils Absolute: 0 10*3/uL (ref 0.0–0.1)
Basophils Relative: 0 % (ref 0–1)
Eosinophils Absolute: 0.1 10*3/uL (ref 0.0–0.7)
Eosinophils Relative: 1 % (ref 0–5)
HEMATOCRIT: 36.2 % (ref 36.0–46.0)
HEMOGLOBIN: 12.2 g/dL (ref 12.0–15.0)
LYMPHS PCT: 24 % (ref 12–46)
Lymphs Abs: 1.3 10*3/uL (ref 0.7–4.0)
MCH: 28.4 pg (ref 26.0–34.0)
MCHC: 33.7 g/dL (ref 30.0–36.0)
MCV: 84.4 fL (ref 78.0–100.0)
MONO ABS: 0.6 10*3/uL (ref 0.1–1.0)
MONOS PCT: 10 % (ref 3–12)
NEUTROS ABS: 3.6 10*3/uL (ref 1.7–7.7)
NEUTROS PCT: 65 % (ref 43–77)
Platelets: 285 10*3/uL (ref 150–400)
RBC: 4.29 MIL/uL (ref 3.87–5.11)
RDW: 14 % (ref 11.5–15.5)
WBC: 5.6 10*3/uL (ref 4.0–10.5)

## 2014-11-18 LAB — COMPREHENSIVE METABOLIC PANEL
ALBUMIN: 4 g/dL (ref 3.5–5.2)
ALT: 16 U/L (ref 0–35)
AST: 17 U/L (ref 0–37)
Alkaline Phosphatase: 72 U/L (ref 39–117)
Anion gap: 10 (ref 5–15)
BILIRUBIN TOTAL: 0.7 mg/dL (ref 0.3–1.2)
BUN: 12 mg/dL (ref 6–23)
CALCIUM: 9.9 mg/dL (ref 8.4–10.5)
CO2: 21 mmol/L (ref 19–32)
Chloride: 104 mmol/L (ref 96–112)
Creatinine, Ser: 0.72 mg/dL (ref 0.50–1.10)
GFR calc Af Amer: >90 mL/min (ref >90)
GFR calc non Af Amer: >90 mL/min (ref >90)
Glucose, Bld: 97 mg/dL (ref 70–99)
POTASSIUM: 3.9 mmol/L (ref 3.5–5.1)
SODIUM: 135 mmol/L (ref 135–145)
TOTAL PROTEIN: 7.5 g/dL (ref 6.0–8.3)

## 2014-11-18 MED ORDER — ALPRAZOLAM 0.5 MG PO TABS: 0.5000 mg | ORAL_TABLET | Freq: Every evening | ORAL | Status: DC | PRN

## 2014-11-18 NOTE — ED Notes (Signed)
Pt has been taking vitamins from [employer] for a while. Since Sunday has had problems sleeping. Since Thursday has only slept 1 hour. Has a rx for zoloft and took a whole pill today and didn't make her sleepy like it normally does. Hasn't had any sleep and is not able to get her thoughts together because of it. Denies any pain.

## 2014-11-18 NOTE — Discharge Instructions (Signed)
Follow up with your md next week for recheck °

## 2014-11-18 NOTE — ED Notes (Signed)
MD at bedside. 

## 2014-11-18 NOTE — ED Provider Notes (Signed)
CSN: [id number]     Arrival date & time 11/18/14  1749 History   First MD Initiated Contact with Patient 11/18/14 1931     Chief Complaint  Patient presents with  . Insomnia  . Anxiety     (Consider location/radiation/quality/duration/timing/severity/associated sxs/prior Treatment) Patient is a 39 y.o. female presenting with anxiety. The history is provided by the patient (pt states she is anxious and has not slept in 2 days.  she wants something to help her sleep).  Anxiety This is a new problem. The current episode started 2 days ago. The problem occurs constantly. The problem has not changed since onset.Pertinent negatives include no chest pain, no abdominal pain and no headaches. Nothing aggravates the symptoms. Nothing relieves the symptoms.    Past Medical History  Diagnosis Date  . Anemia    Past Surgical History  Procedure Laterality Date  . Orif tibia & fibula fractures    . Femur fracture surgery    . Cesarean section     No family history on file. History  Substance Use Topics  . Smoking status: Former Smoker -- 0.50 packs/day    Quit date: 04/30/2012  . Smokeless tobacco: Not on file  . Alcohol Use: 0.6 oz/week    1 Glasses of wine per week   OB History    No data available     Review of Systems  Constitutional: Negative for appetite change and fatigue.  HENT: Negative for congestion, ear discharge and sinus pressure.   Eyes: Negative for discharge.  Respiratory: Negative for cough.   Cardiovascular: Negative for chest pain.  Gastrointestinal: Negative for abdominal pain and diarrhea.  Genitourinary: Negative for frequency and hematuria.  Musculoskeletal: Negative for back pain.  Skin: Negative for rash.  Neurological: Negative for seizures and headaches.  Psychiatric/Behavioral: Positive for agitation. Negative for hallucinations.      Allergies  Advil; Mirena; and Biotin  Home Medications   Prior to Admission medications   Medication Sig  Start Date End Date Taking? Authorizing Provider  OVER THE COUNTER MEDICATION Take 1 tablet by mouth daily. FATFIGHTER  WITH CARB INHIBITORS   Yes Historical Provider, MD  OVER THE COUNTER MEDICATION Take 1 tablet by mouth See admin instructions. Medication: Thermofit   Yes Historical Provider, MD  sertraline (ZOLOFT) 50 MG tablet Take 50 mg by mouth daily.   Yes Historical Provider, MD  ALPRAZolam Duanne Moron) 0.5 MG tablet Take 1 tablet (0.5 mg total) by mouth at bedtime as needed for sleep. 11/18/14   Milton Ferguson, MD   BP 123/92 mmHg  Pulse 82  Temp(Src) 99.4 F (37.4 C) (Oral)  Resp 20  Ht 5' 5.25" (1.657 m)  Wt 163 lb (73.936 kg)  BMI 26.93 kg/m2  SpO2 99%  LMP 10/19/2014 Physical Exam  Constitutional: She is oriented to person, place, and time. She appears well-developed.  HENT:  Head: Normocephalic.  Eyes: Conjunctivae and EOM are normal. No scleral icterus.  Neck: Neck supple. No thyromegaly present.  Cardiovascular: Normal rate and regular rhythm.  Exam reveals no gallop and no friction rub.   No murmur heard. Pulmonary/Chest: No stridor. She has no wheezes. She has no rales. She exhibits no tenderness.  Abdominal: She exhibits no distension. There is no tenderness. There is no rebound.  Musculoskeletal: Normal range of motion. She exhibits no edema.  Lymphadenopathy:    She has no cervical adenopathy.  Neurological: She is oriented to person, place, and time. She exhibits normal muscle tone. Coordination normal.  Skin:  No rash noted. No erythema.  Psychiatric:  Mild anxiety    ED Course  Procedures (including critical care time) Labs Review Labs Reviewed  CBC WITH DIFFERENTIAL/PLATELET  COMPREHENSIVE METABOLIC PANEL    Imaging Review No results found.   EKG Interpretation None      MDM   Final diagnoses:  Insomnia    Insomnia,  tx with xanax and follow up with pcp    Milton Ferguson, MD 11/18/14 2116

## 2014-11-18 NOTE — ED Notes (Signed)
Phlebotomy at bedside.

## 2016-10-18 ENCOUNTER — Emergency Department (HOSPITAL_COMMUNITY)
Admission: EM | Admit: 2016-10-18 | Discharge: 2016-10-18 | Disposition: A | Discharge status: Home / Self Care | Payer: BC Managed Care – PPO | Attending: Emergency Medicine | Admitting: Emergency Medicine

## 2016-10-18 ENCOUNTER — Encounter (HOSPITAL_COMMUNITY): Payer: Self-pay | Admitting: Emergency Medicine

## 2016-10-18 DIAGNOSIS — F410 Panic disorder [episodic paroxysmal anxiety] without agoraphobia: Secondary | ICD-10-CM

## 2016-10-18 DIAGNOSIS — F419 Anxiety disorder, unspecified: Secondary | ICD-10-CM | POA: Insufficient documentation

## 2016-10-18 DIAGNOSIS — Z87891 Personal history of nicotine dependence: Secondary | ICD-10-CM | POA: Insufficient documentation

## 2016-10-18 HISTORY — DX: Depression, unspecified: F32.A

## 2016-10-18 HISTORY — DX: Anxiety disorder, unspecified: F41.9

## 2016-10-18 HISTORY — DX: Major depressive disorder, single episode, unspecified: F32.9

## 2016-10-18 NOTE — Discharge Instructions (Signed)
Please follow-up with your primary care provider. Please schedule appointment with psychiatry on Monday regarding today's visit. Please continue your support group and decrease any exposure to triggers.   Get help right away if: You experience panic attack symptoms that are different than your usual symptoms. You have serious thoughts about hurting yourself or others. You are taking medicine for panic attacks and have a serious side effect.

## 2016-10-18 NOTE — ED Provider Notes (Signed)
[facility] DEPT Provider Note   CSN: [id number] Arrival date & time: 10/18/16  1501   By signing my name below, I, Soijett Blue, attest that this documentation has been prepared under the direction and in the presence of Heath Lark, PA-C Electronically Signed: [place], ED Scribe. 10/18/16. 4:26 PM.  History   Chief Complaint Chief Complaint  Patient presents with  . Anxiety    HPI Kelly Joyce is a 41 y.o. female with a PMHx of anxiety, depression, who presents to the Emergency Department complaining of anxiety onset 2 PM today. Pt reports resolved associated symptoms of tingling to bilateral fingers and legs, palpitations, SOB, numbness to lower lip, lightheadedness, and chills. Pt was given oxygen therapy by paramedics that were called out with mild relief of her symptoms. She notes that she was in church when she began to have a panic attack that lasted approximately 40 minutes. Pt reports that she is now at her baseline and denies any pain or symptoms. She notes that she has had increased marital stress with an argument with her spouse last night that triggered her symptoms. She notes that she has a hx of panic attacks with her last one being 2 nights ago, but notes that her episode today was more severe. She also admits to having something similar to day's symptoms about 1-2 years ago. Pt denies recent evaluation of her panic attacks, but notes that she used to take anti-depressants that she d/c herself due to weight gain last year. Pt reports that following discontinuing the medications, she didn't sleep for two days and was evaluated at a hospital and given xanax with relief of her symptoms. Pt notes that she has a PCP that she hasnt informed about her history of panic attacks. She denies LOC, CP, recent injury, recent fall, and any other symptoms.     The history is provided by the patient.  No language interpreter was used.    Past Medical History:  Diagnosis Date  .  Anemia   . Anxiety   . Depression     Patient Active Problem List   Diagnosis Date Noted  . Fibroids, submucosal 07/07/2011  . Anemia, iron deficiency 06/16/2011    Past Surgical History:  Procedure Laterality Date  . CESAREAN SECTION    . FEMUR FRACTURE SURGERY    . ORIF TIBIA & FIBULA FRACTURES    . WISDOM TOOTH EXTRACTION      OB History    No data available       Home Medications    Prior to Admission medications   Medication Sig Start Date End Date Taking? Authorizing Provider  ALPRAZolam Duanne Moron) 0.5 MG tablet Take 1 tablet (0.5 mg total) by mouth at bedtime as needed for sleep. Patient not taking: Reported on 10/18/2016 11/18/14   Milton Ferguson, MD    Family History History reviewed. No pertinent family history.  Social History Social History  Substance Use Topics  . Smoking status: Former Smoker    Packs/day: 0.50    Quit date: 04/30/2012  . Smokeless tobacco: Never Used  . Alcohol use 0.6 oz/week    1 Glasses of wine per week     Allergies   Advil [ibuprofen]; Mirena [levonorgestrel]; and Biotin   Review of Systems Review of Systems  Constitutional: Positive for chills.  Respiratory: Positive for shortness of breath (resolved).   Cardiovascular: Positive for palpitations (resolved). Negative for chest pain.  Neurological: Positive for light-headedness and numbness (resolved; lower lip). Negative for syncope.       +  Resolved tingling to bilateral fingers and legs.  Psychiatric/Behavioral: The patient is nervous/anxious.      Physical Exam Updated Vital Signs BP 124/82 (BP Location: Left Arm)   Pulse 84   Temp 98.4 F (36.9 C) (Oral)   Resp 18   Wt 72.6 kg   LMP 09/26/2016 (Exact Date)   SpO2 100%   BMI 26.42 kg/m   Physical Exam  Constitutional: She appears well-developed and well-nourished.  Well appearing  HENT:  Head: Normocephalic and atraumatic.  Nose: Nose normal.  Eyes: Conjunctivae and EOM are normal.  Neck: Normal range  of motion.  Cardiovascular: Normal rate, regular rhythm and normal heart sounds.  Exam reveals no gallop and no friction rub.   No murmur heard. Pulmonary/Chest: Effort normal and breath sounds normal. No respiratory distress. She has no wheezes. She has no rales.  Normal work of breathing. No respiratory distress noted.   Abdominal: Soft. There is no tenderness.  Musculoskeletal: Normal range of motion.  Neurological: She is alert.  Skin: Skin is warm.  Psychiatric: She has a normal mood and affect. Her behavior is normal.  Nursing note and vitals reviewed.    ED Treatments / Results  DIAGNOSTIC STUDIES: Oxygen Saturation is 100% on RA, nl by my interpretation.    COORDINATION OF CARE: 4:24 PM Discussed treatment plan with pt at bedside and pt agreed to plan.   Procedures Procedures (including critical care time)  Medications Ordered in ED Medications - No data to display   Initial Impression / Assessment and Plan / ED Course  I have reviewed the triage vital signs and the nursing notes.  Patient presents to the emergency department complaining of symptoms consistent with anxiety.  Patient has a history of same with similar episodes.  The patient is resting comfortably, in no apparent distress and asymptomatic.  vital signs reviewed.  No exophthalmos, no signs of UTI.  Stress reducing mechanisms discussed including caffeine intake.  Patient has been referred to psychiatric services for follow-up.  Pt also encouraged to follow up with PCP. Pt declined any medication. Feels safe for discharge at this time. Reasons to immediately return to ED discussed.   Final Clinical Impressions(s) / ED Diagnoses   Final diagnoses:  Anxiety attack    New Prescriptions Discharge Medication List as of 10/18/2016  4:22 PM     I personally performed the services described in this documentation, which was scribed in my presence. The recorded information has been reviewed and is  accurate.   [place], [place] 10/18/16 [place], MD 10/19/16 [phone]

## 2016-10-18 NOTE — ED Triage Notes (Signed)
Pt stated that he had a panic attack today. Stated that she was at church today, denies any specific trigger for her anxiety. Denies SI/HI. Stated that she was on Zoloft 2 years ago and took herself off of it. Pt is currently alert, cooperative and calm. A friend is waiting in the lobby.

## 2016-10-18 NOTE — ED Triage Notes (Signed)
Per EMS- Pt called EMS with c/o chest pain and anxiety. Pt found to be c/o anxiety and tingling of hands and arms. Pt remains alert, appropriate and ambulatory.  Pt reports hx of previous incidents.-Last one 3 days ago

## 2016-10-18 NOTE — ED Notes (Signed)
Bed: [facility] Expected date:  Expected time:  Means of arrival:  Comments: 

## 2018-02-17 ENCOUNTER — Other Ambulatory Visit: Payer: Self-pay

## 2018-02-17 ENCOUNTER — Emergency Department (HOSPITAL_COMMUNITY): Payer: No Typology Code available for payment source

## 2018-02-17 ENCOUNTER — Emergency Department (HOSPITAL_COMMUNITY)
Admission: EM | Admit: 2018-02-17 | Discharge: 2018-02-17 | Disposition: A | Discharge status: Home / Self Care | Payer: No Typology Code available for payment source | Attending: Emergency Medicine | Admitting: Emergency Medicine

## 2018-02-17 ENCOUNTER — Encounter (HOSPITAL_COMMUNITY): Payer: Self-pay | Admitting: Emergency Medicine

## 2018-02-17 DIAGNOSIS — R519 Headache, unspecified: Secondary | ICD-10-CM

## 2018-02-17 DIAGNOSIS — Z87891 Personal history of nicotine dependence: Secondary | ICD-10-CM | POA: Diagnosis not present

## 2018-02-17 DIAGNOSIS — F419 Anxiety disorder, unspecified: Secondary | ICD-10-CM | POA: Insufficient documentation

## 2018-02-17 DIAGNOSIS — R51 Headache: Secondary | ICD-10-CM | POA: Diagnosis present

## 2018-02-17 NOTE — ED Triage Notes (Signed)
Pt reports the HA started with panic attack the she had on last Thursday and Friday.

## 2018-02-17 NOTE — ED Triage Notes (Signed)
Pt. Stated, I had a head , nagging pain in my head ion the left , started last Thursday.

## 2018-02-17 NOTE — Discharge Instructions (Addendum)
Follow-up with your family doctor.  Return to ER for worsening or concerning symptoms. CT of your head today is normal, no evidence of stroke or brain tumor. You can try neck massage or warm compresses to the neck as discussed for 20 minutes at a time.  Consider Tylenol if you have tolerated this previously.

## 2018-02-17 NOTE — ED Provider Notes (Signed)
[facility]ERGENCY DEPARTMENT Provider Note   CSN: [id number] Arrival date & time: 02/17/18  1026     History   Chief Complaint Chief Complaint  Patient presents with  . Headache    HPI Kelly Joyce is a 42 y.o. female.  42 year old female presents with complaint of left occipital headache x5 days.  Patient states pain is constant, dull and aching in nature, waxes and wanes in intensity.  Pain does not radiate, nothing makes pain better or worse, she has not taken anything for her pain as she states she does not tolerate medications well.  Patient denies changes in vision, speech, gait.  No history of prior migraines, states that she has had an occasional headache.  No family history of brain aneurysm or stroke.  Last CT of her head was in 2011.  Patient states that she had a panic attack for the 2 days prior to the onset of her headache however has never had a headache following her panic attacks previously.  Denies falls or injury.  Patient states she is feeling very anxious about her headache and is concerned for her health.  No other complaints or concerns.     Past Medical History:  Diagnosis Date  . Anemia   . Anxiety   . Depression     Patient Active Problem List   Diagnosis Date Noted  . Fibroids, submucosal 07/07/2011  . Anemia, iron deficiency 06/16/2011    Past Surgical History:  Procedure Laterality Date  . CESAREAN SECTION    . FEMUR FRACTURE SURGERY    . ORIF TIBIA & FIBULA FRACTURES    . WISDOM TOOTH EXTRACTION       OB History   None      Home Medications    Prior to Admission medications   Medication Sig Start Date End Date Taking? Authorizing Provider  ALPRAZolam Duanne Moron) 0.5 MG tablet Take 1 tablet (0.5 mg total) by mouth at bedtime as needed for sleep. Patient not taking: Reported on 10/18/2016 11/18/14   Milton Ferguson, MD    Family History No family history on file.  Social History Social History   Tobacco Use   . Smoking status: Former Smoker    Packs/day: 0.50    Last attempt to quit: 04/30/2012    Years since quitting: 5.8  . Smokeless tobacco: Never Used  Substance Use Topics  . Alcohol use: Yes    Alcohol/week: 0.6 oz    Types: 1 Glasses of wine per week  . Drug use: No     Allergies   Advil [ibuprofen]; Mirena [levonorgestrel]; and Biotin   Review of Systems Review of Systems  Constitutional: Negative for chills and fever.  Eyes: Negative for visual disturbance.  Gastrointestinal: Negative for nausea and vomiting.  Musculoskeletal: Negative for arthralgias, gait problem, joint swelling, myalgias, neck pain and neck stiffness.  Skin: Negative for rash and wound.  Allergic/Immunologic: Negative for immunocompromised state.  Neurological: Positive for headaches. Negative for dizziness, speech difficulty, weakness, light-headedness and numbness.  Hematological: Does not bruise/bleed easily.  Psychiatric/Behavioral: Negative for confusion.  All other systems reviewed and are negative.    Physical Exam Updated Vital Signs BP (!) 129/92 (BP Location: Right Arm)   Pulse 90   Temp 98.9 F (37.2 C) (Oral)   Resp 18   Ht 5\' 5"  (1.651 m)   Wt 77.1 kg (170 lb)   LMP 02/03/2018   SpO2 100%   BMI 28.29 kg/m   Physical Exam  Constitutional: She is oriented to person, place, and time. She appears well-developed and well-nourished.  Non-toxic appearance. She does not appear ill.  HENT:  Head: Normocephalic and atraumatic.  Eyes: Pupils are equal, round, and reactive to light. EOM are normal. Right eye exhibits normal extraocular motion and no nystagmus. Left eye exhibits normal extraocular motion and no nystagmus. Right pupil is round and reactive. Left pupil is round and reactive. Pupils are equal.  Neck: Normal range of motion.  Cardiovascular: Normal rate and normal heart sounds.  Pulmonary/Chest: Effort normal and breath sounds normal. No respiratory distress.  Neurological:  She is alert and oriented to person, place, and time. She has normal strength. She displays normal reflexes. No cranial nerve deficit or sensory deficit. Coordination and gait normal. GCS eye subscore is 4. GCS verbal subscore is 5. GCS motor subscore is 6.  Reflex Scores:      Tricep reflexes are 2+ on the right side and 2+ on the left side.      Bicep reflexes are 2+ on the right side and 2+ on the left side.      Brachioradialis reflexes are 2+ on the right side and 2+ on the left side.      Patellar reflexes are 2+ on the right side and 2+ on the left side. Psychiatric: Her behavior is normal. Her mood appears anxious. She is not agitated.  Nursing note and vitals reviewed.    ED Treatments / Results  Labs (all labs ordered are listed, but only abnormal results are displayed) Labs Reviewed - No data to display  EKG None  Radiology Ct Head Wo Contrast  Result Date: 02/17/2018 CLINICAL DATA:  Posterior headache for the past 5 days. Normal neurological exam. EXAM: CT HEAD WITHOUT CONTRAST TECHNIQUE: Contiguous axial images were obtained from the base of the skull through the vertex without intravenous contrast. COMPARISON:  05/18/2010. FINDINGS: Brain: Normal appearing cerebral hemispheres and posterior fossa structures. Normal size and position of the ventricles. No intracranial hemorrhage, mass lesion or CT evidence of acute infarction. Vascular: No hyperdense vessel or unexpected calcification. Skull: Normal. Negative for fracture or focal lesion. Sinuses/Orbits: Unremarkable. Other: None. IMPRESSION: Normal examination, unchanged. Electronically Signed   By: Claudie Revering M.D.   On: 02/17/2018 12:46    Procedures Procedures (including critical care time)  Medications Ordered in ED Medications - No data to display   Initial Impression / Assessment and Plan / ED Course  I have reviewed the triage vital signs and the nursing notes.  Pertinent labs & imaging results that were  available during my care of the patient were reviewed by me and considered in my medical decision making (see chart for details).  Clinical Course as of Feb 17 1250  Wed Feb 18, 1831  [id number] 42 year old female presents with headache x5 days.  Patient is anxious and concerned that her headache is due to some problem developing within her brain.  Her exam is unremarkable.  CT of the head is unremarkable.  Recommend patient follow-up with her PCP.   [LM]    Clinical Course User Index [LM] Tacy Learn, PA-C    Final Clinical Impressions(s) / ED Diagnoses   Final diagnoses:  Acute nonintractable headache, unspecified headache type    ED Discharge Orders    None       Roque Lias 02/17/18 1251    Mesner, Corene Cornea, MD 02/19/18 2336

## 2018-02-17 NOTE — ED Notes (Signed)
Declined W/C at D/C and was escorted to lobby by RN. 

## 2019-04-23 ENCOUNTER — Emergency Department (HOSPITAL_COMMUNITY)
Admission: EM | Admit: 2019-04-23 | Discharge: 2019-04-23 | Disposition: A | Discharge status: Home / Self Care | Payer: BC Managed Care – PPO | Attending: Emergency Medicine | Admitting: Emergency Medicine

## 2019-04-23 ENCOUNTER — Emergency Department (HOSPITAL_COMMUNITY): Payer: BC Managed Care – PPO

## 2019-04-23 ENCOUNTER — Encounter (HOSPITAL_COMMUNITY): Payer: Self-pay

## 2019-04-23 ENCOUNTER — Other Ambulatory Visit: Payer: Self-pay

## 2019-04-23 DIAGNOSIS — R109 Unspecified abdominal pain: Secondary | ICD-10-CM

## 2019-04-23 DIAGNOSIS — R1011 Right upper quadrant pain: Secondary | ICD-10-CM | POA: Insufficient documentation

## 2019-04-23 DIAGNOSIS — R1031 Right lower quadrant pain: Secondary | ICD-10-CM | POA: Diagnosis not present

## 2019-04-23 DIAGNOSIS — Z87891 Personal history of nicotine dependence: Secondary | ICD-10-CM | POA: Insufficient documentation

## 2019-04-23 LAB — CBC WITH DIFFERENTIAL/PLATELET
Abs Immature Granulocytes: 0.01 10*3/uL (ref 0.00–0.07)
Basophils Absolute: 0 10*3/uL (ref 0.0–0.1)
Basophils Relative: 1 %
Eosinophils Absolute: 0.1 10*3/uL (ref 0.0–0.5)
Eosinophils Relative: 2 %
HCT: 36.7 % (ref 36.0–46.0)
Hemoglobin: 11.9 g/dL — ABNORMAL LOW (ref 12.0–15.0)
Immature Granulocytes: 0 %
Lymphocytes Relative: 38 %
Lymphs Abs: 1.3 10*3/uL (ref 0.7–4.0)
MCH: 28.4 pg (ref 26.0–34.0)
MCHC: 32.4 g/dL (ref 30.0–36.0)
MCV: 87.6 fL (ref 80.0–100.0)
Monocytes Absolute: 0.3 10*3/uL (ref 0.1–1.0)
Monocytes Relative: 10 %
Neutro Abs: 1.6 10*3/uL — ABNORMAL LOW (ref 1.7–7.7)
Neutrophils Relative %: 49 %
Platelets: 328 10*3/uL (ref 150–400)
RBC: 4.19 MIL/uL (ref 3.87–5.11)
RDW: 14.3 % (ref 11.5–15.5)
WBC: 3.3 10*3/uL — ABNORMAL LOW (ref 4.0–10.5)
nRBC: 0 % (ref 0.0–0.2)

## 2019-04-23 LAB — COMPREHENSIVE METABOLIC PANEL
ALT: 10 U/L (ref 0–44)
AST: 13 U/L — ABNORMAL LOW (ref 15–41)
Albumin: 3.8 g/dL (ref 3.5–5.0)
Alkaline Phosphatase: 52 U/L (ref 38–126)
Anion gap: 8 (ref 5–15)
BUN: 9 mg/dL (ref 6–20)
CO2: 25 mmol/L (ref 22–32)
Calcium: 9 mg/dL (ref 8.9–10.3)
Chloride: 104 mmol/L (ref 98–111)
Creatinine, Ser: 0.57 mg/dL (ref 0.44–1.00)
GFR calc Af Amer: >60 mL/min (ref >60)
GFR calc non Af Amer: >60 mL/min (ref >60)
Glucose, Bld: 103 mg/dL — ABNORMAL HIGH (ref 70–99)
Potassium: 4.1 mmol/L (ref 3.5–5.1)
Sodium: 137 mmol/L (ref 135–145)
Total Bilirubin: 0.5 mg/dL (ref 0.3–1.2)
Total Protein: 7.2 g/dL (ref 6.5–8.1)

## 2019-04-23 LAB — URINALYSIS, ROUTINE W REFLEX MICROSCOPIC
Bacteria, UA: NONE SEEN
Bilirubin Urine: NEGATIVE
Glucose, UA: NEGATIVE mg/dL
Ketones, ur: NEGATIVE mg/dL
Leukocytes,Ua: NEGATIVE
Nitrite: NEGATIVE
Protein, ur: NEGATIVE mg/dL
Specific Gravity, Urine: 1.012 (ref 1.005–1.030)
pH: 6 (ref 5.0–8.0)

## 2019-04-23 LAB — I-STAT BETA HCG BLOOD, ED (MC, WL, AP ONLY): I-stat hCG, quantitative: <5 m[IU]/mL (ref <5)

## 2019-04-23 MED ORDER — HYDROCODONE-ACETAMINOPHEN 5-325 MG PO TABS
1.0000 | ORAL_TABLET | Freq: Once | ORAL | Status: DC
  Filled 2019-04-23: qty 1

## 2019-04-23 NOTE — ED Provider Notes (Signed)
[facility] EMERGENCY DEPARTMENT Provider Note   CSN: [id number] Arrival date & time: 04/23/19  1255   History   Chief Complaint Chief Complaint  Patient presents with  . Flank Pain   HPI Kelly Joyce is a 43 y.o. female with past medical history significant for anemia, anxiety who presents for evaluation of flank pain.  Patient states 3 days ago she developed sudden onset right flank pain.  Patient states pain originally intermittent however is becoming more constant.  States occasionally the pain will radiate into her right lower quadrant.  Prior history of stones.  Patient states pain is worse with movement.  She denies fever, chills, nausea, vomiting, chest pain, shortness of breath, abdominal pain, pelvic pain, vaginal discharge, concerns for STDs, hematuria, dysuria, diarrhea, constipation.  She has been taking Tylenol at home with mild relief of her pain.  Denies additional aggravating or alleviating factors.  History obtained from patient and past medical records.  No interpreter was used.    HPI  Past Medical History:  Diagnosis Date  . Anemia   . Anxiety   . Depression     Patient Active Problem List   Diagnosis Date Noted  . Fibroids, submucosal 07/07/2011  . Anemia, iron deficiency 06/16/2011    Past Surgical History:  Procedure Laterality Date  . CESAREAN SECTION    . FEMUR FRACTURE SURGERY    . ORIF TIBIA & FIBULA FRACTURES    . WISDOM TOOTH EXTRACTION       OB History   No obstetric history on file.      Home Medications    Prior to Admission medications   Medication Sig Start Date End Date Taking? Authorizing Provider  ALPRAZolam Duanne Moron) 0.5 MG tablet Take 1 tablet (0.5 mg total) by mouth at bedtime as needed for sleep. Patient not taking: Reported on 10/18/2016 11/18/14   Milton Ferguson, MD    Family History History reviewed. No pertinent family history.  Social History Social History   Tobacco Use  . Smoking status:  Former Smoker    Packs/day: 0.50    Quit date: 04/30/2012    Years since quitting: 6.9  . Smokeless tobacco: Never Used  Substance Use Topics  . Alcohol use: Yes    Alcohol/week: 1.0 standard drinks    Types: 1 Glasses of wine per week  . Drug use: No     Allergies   Advil [ibuprofen], Mirena [levonorgestrel], and Biotin   Review of Systems Review of Systems  Constitutional: Negative.   HENT: Negative.   Respiratory: Negative.   Cardiovascular: Negative.   Gastrointestinal: Negative.   Genitourinary: Positive for flank pain. Negative for decreased urine volume, difficulty urinating, dyspareunia, dysuria, enuresis, frequency, genital sores, hematuria, menstrual problem, pelvic pain, urgency, vaginal bleeding, vaginal discharge and vaginal pain.  Musculoskeletal: Negative for arthralgias, gait problem, joint swelling, myalgias, neck pain and neck stiffness.  Skin: Negative.   Neurological: Negative.   All other systems reviewed and are negative.    Physical Exam Updated Vital Signs BP (!) 128/91 (BP Location: Right Arm)   Pulse 69   Temp 98.4 F (36.9 C) (Oral)   Resp 20   Ht 5\' 5"  (1.651 m)   Wt 74.8 kg   LMP 04/11/2019 (Exact Date)   SpO2 100%   BMI 27.46 kg/m   Physical Exam Vitals signs and nursing note reviewed.  Constitutional:      General: She is not in acute distress.    Appearance: She is well-developed.  She is not ill-appearing, toxic-appearing or diaphoretic.  HENT:     Head: Atraumatic.  Eyes:     Pupils: Pupils are equal, round, and reactive to light.  Neck:     Musculoskeletal: Normal range of motion.  Cardiovascular:     Rate and Rhythm: Normal rate.     Pulses: Normal pulses.     Heart sounds: Normal heart sounds.  Pulmonary:     Effort: Pulmonary effort is normal. No respiratory distress.     Breath sounds: Normal breath sounds.  Abdominal:     General: Bowel sounds are normal. There is no distension.     Palpations: Abdomen is soft.      Tenderness: There is no abdominal tenderness. There is no right CVA tenderness, left CVA tenderness, guarding or rebound. Negative signs include Murphy's sign and McBurney's sign.     Hernia: No hernia is present.  Musculoskeletal: Normal range of motion.     Thoracic back: Normal.     Lumbar back: Normal.       Back:  Skin:    General: Skin is warm and dry.     Comments: No vesicles, edema, erythema or warmth.  Neurological:     Mental Status: She is alert.     Comments: Cranial nerves II through XII grossly intact    ED Treatments / Results  Labs (all labs ordered are listed, but only abnormal results are displayed) Labs Reviewed  CBC WITH DIFFERENTIAL/PLATELET - Abnormal; Notable for the following components:      Result Value   WBC 3.3 (*)    Hemoglobin 11.9 (*)    Neutro Abs 1.6 (*)    All other components within normal limits  COMPREHENSIVE METABOLIC PANEL - Abnormal; Notable for the following components:   Glucose, Bld 103 (*)    AST 13 (*)    All other components within normal limits  URINALYSIS, ROUTINE W REFLEX MICROSCOPIC - Abnormal; Notable for the following components:   Color, Urine STRAW (*)    Hgb urine dipstick SMALL (*)    All other components within normal limits  I-STAT BETA HCG BLOOD, ED ([facility], [facility], [facility] ONLY)    EKG None  Radiology No results found.  Procedures Procedures (including critical care time)  Medications Ordered in ED Medications  HYDROcodone-acetaminophen (NORCO/VICODIN) 5-325 MG per tablet 1 tablet (1 tablet Oral Refused 04/23/19 1524)   Initial Impression / Assessment and Plan / ED Course  I have reviewed the triage vital signs and the nursing notes.  Pertinent labs & imaging results that were available during my care of the patient were reviewed by me and considered in my medical decision making (see chart for details).  74 of female peers otherwise well presents for evaluation of right flank pain.  She is afebrile, nonseptic,  non-ill-appearing.  Initially intermittent however pain more constant.  She does have some radiation to her right groin.  Her abdomen is soft, nontender without rebound or guarding.  Tenderness to right flank without any overlying skin changes, specifically no vesicular changes.  Heart and lungs clear.  No prior history of renal stones.  No urinary symptoms. Negative murphy sign. Low suspicion for cholecystitis or cholelithiasis as cause of her pain.  CBC with leukopenia at 3.3 Metabolic panel without electrolyte, renal or liver abnormality Pregnancy test negative Urinalysis negative for infection however small blood.  On reevaluation abdomen soft, nontender without rebound or guarding.  She did not want anything for pain at this time. Renal  stone vs MSK pain.  Patient pending CT stone at care transfer for [facility] who will follow up on CT. If negative for stone plan to dc home with RICE for MSK pain and PCP follow up. If stone can dc home with pain management and Urology follow up.      Final Clinical Impressions(s) / ED Diagnoses   Final diagnoses:  Right flank pain    ED Discharge Orders    None       Areal Cochrane A, PA-C 04/23/19 1609    Virgel Manifold, MD 05/04/19 [phone]

## 2019-04-23 NOTE — ED Notes (Signed)
Called x1 no response

## 2019-04-23 NOTE — Discharge Instructions (Signed)
Try a heating pad on the area and take Tylenol as needed for pain Please make a follow up appointment with your doctor Return if you are worsening

## 2019-04-23 NOTE — ED Triage Notes (Signed)
Pt presents to ED with right sided flank pain for 2 days.  Pt denies hx of kidney stones.  Pt denies N/V/D. Pt denies urinary symptoms.  Denies fever.

## 2019-05-19 ENCOUNTER — Other Ambulatory Visit: Payer: Self-pay

## 2019-05-19 DIAGNOSIS — Z20822 Contact with and (suspected) exposure to covid-19: Secondary | ICD-10-CM

## 2019-05-22 LAB — NOVEL CORONAVIRUS, NAA: SARS-CoV-2, NAA: NOT DETECTED

## 2019-11-24 ENCOUNTER — Ambulatory Visit: Payer: BC Managed Care – PPO

## 2019-11-24 ENCOUNTER — Ambulatory Visit: Payer: BC Managed Care – PPO | Attending: Internal Medicine

## 2019-11-24 DIAGNOSIS — Z23 Encounter for immunization: Secondary | ICD-10-CM

## 2019-11-24 NOTE — Progress Notes (Signed)
   Covid-19 Vaccination Clinic  Name:  Kelly Joyce    MRN: [id number] DOB: Aug 09, 1975  11/24/2019  Ms. Reveal was observed post Covid-19 immunization for 15 minutes without incident. She was provided with Vaccine Information Sheet and instruction to access the V-Safe system.   Ms. [name] was instructed to call 911 with any severe reactions post vaccine: Marland Kitchen Difficulty breathing  . Swelling of face and throat  . A fast heartbeat  . A bad rash all over body  . Dizziness and weakness   Immunizations Administered    Name Date Dose VIS Date Route   Pfizer COVID-19 Vaccine 11/24/2019  2:17 PM 0.3 mL 09/21/2018 Intramuscular   Manufacturer: [place]   Lot: [id number]   [place]: [id number]

## 2019-12-19 ENCOUNTER — Ambulatory Visit: Payer: BC Managed Care – PPO | Attending: Internal Medicine

## 2019-12-19 DIAGNOSIS — Z23 Encounter for immunization: Secondary | ICD-10-CM

## 2019-12-19 NOTE — Progress Notes (Signed)
   Covid-19 Vaccination Clinic  Name:  Kitty Immekus    MRN: [id number] DOB: 08-09-1975  12/19/2019  Ms. Canet was observed post Covid-19 immunization for 15 minutes without incident. She was provided with Vaccine Information Sheet and instruction to access the V-Safe system.   Ms. Earls was instructed to call 911 with any severe reactions post vaccine: Marland Kitchen Difficulty breathing  . Swelling of face and throat  . A fast heartbeat  . A bad rash all over body  . Dizziness and weakness   Immunizations Administered    Name Date Dose VIS Date Route   Pfizer COVID-19 Vaccine 12/19/2019  2:36 PM 0.3 mL 09/21/2018 Intramuscular   Manufacturer: [place]   Lot: [id number]   [place]: [id number]

## 2020-07-02 ENCOUNTER — Other Ambulatory Visit: Payer: BC Managed Care – PPO

## 2020-07-02 ENCOUNTER — Ambulatory Visit: Payer: BC Managed Care – PPO | Attending: Internal Medicine

## 2020-07-02 DIAGNOSIS — Z23 Encounter for immunization: Secondary | ICD-10-CM

## 2020-07-02 NOTE — Progress Notes (Signed)
   Covid-19 Vaccination Clinic  Name:  Kelly Joyce    MRN: [id number] DOB: 1975-08-23  07/02/2020  [name] was observed post Covid-19 immunization for 15 minutes without incident. She was provided with Vaccine Information Sheet and instruction to access the V-Safe system.   [name] was instructed to call 911 with any severe reactions post vaccine: Marland Kitchen Difficulty breathing  . Swelling of face and throat  . A fast heartbeat  . A bad rash all over body  . Dizziness and weakness   Immunizations Administered    Name Date Dose VIS Date Route   Pfizer COVID-19 Vaccine 07/02/2020  5:48 PM 0.3 mL 05/16/2020 Intramuscular   Manufacturer: [place]   Lot: [id number]   [place]: [id number]

## 2020-08-17 ENCOUNTER — Other Ambulatory Visit: Payer: BC Managed Care – PPO

## 2020-08-17 DIAGNOSIS — Z20822 Contact with and (suspected) exposure to covid-19: Secondary | ICD-10-CM

## 2020-08-19 LAB — NOVEL CORONAVIRUS, NAA: SARS-CoV-2, NAA: NOT DETECTED

## 2020-08-19 LAB — SARS-COV-2, NAA 2 DAY TAT

## 2020-08-21 ENCOUNTER — Other Ambulatory Visit: Payer: No Typology Code available for payment source

## 2020-08-22 ENCOUNTER — Other Ambulatory Visit: Payer: No Typology Code available for payment source

## 2020-08-22 DIAGNOSIS — Z20822 Contact with and (suspected) exposure to covid-19: Secondary | ICD-10-CM

## 2020-08-23 LAB — NOVEL CORONAVIRUS, NAA: SARS-CoV-2, NAA: DETECTED — AB

## 2020-08-23 LAB — SARS-COV-2, NAA 2 DAY TAT

## 2021-06-15 ENCOUNTER — Telehealth: Payer: No Typology Code available for payment source | Admitting: Nurse Practitioner

## 2021-06-15 DIAGNOSIS — J00 Acute nasopharyngitis [common cold]: Secondary | ICD-10-CM

## 2021-06-15 MED ORDER — FLUTICASONE PROPIONATE 50 MCG/ACT NA SUSP: 2.0000 | Freq: Every day | NASAL | 6 refills | Status: DC

## 2021-06-15 NOTE — Progress Notes (Signed)

## 2023-05-30 ENCOUNTER — Other Ambulatory Visit: Payer: Self-pay

## 2023-05-30 ENCOUNTER — Encounter (HOSPITAL_COMMUNITY): Payer: Self-pay

## 2023-05-30 ENCOUNTER — Emergency Department (HOSPITAL_COMMUNITY)
Admission: EM | Admit: 2023-05-30 | Discharge: 2023-05-30 | Disposition: A | Discharge status: Home / Self Care | Payer: BC Managed Care – PPO | Attending: Emergency Medicine | Admitting: Emergency Medicine

## 2023-05-30 DIAGNOSIS — N939 Abnormal uterine and vaginal bleeding, unspecified: Secondary | ICD-10-CM | POA: Diagnosis present

## 2023-05-30 LAB — BASIC METABOLIC PANEL
Anion gap: 10 (ref 5–15)
BUN: 9 mg/dL (ref 6–20)
CO2: 23 mmol/L (ref 22–32)
Calcium: 9 mg/dL (ref 8.9–10.3)
Chloride: 104 mmol/L (ref 98–111)
Creatinine, Ser: 0.51 mg/dL (ref 0.44–1.00)
GFR, Estimated: >60 mL/min (ref >60)
Glucose, Bld: 84 mg/dL (ref 70–99)
Potassium: 3.9 mmol/L (ref 3.5–5.1)
Sodium: 137 mmol/L (ref 135–145)

## 2023-05-30 LAB — TYPE AND SCREEN
ABO/RH(D): O POS
Antibody Screen: NEGATIVE

## 2023-05-30 LAB — CBC
HCT: 21.4 % — ABNORMAL LOW (ref 36.0–46.0)
Hemoglobin: 6.7 g/dL — CL (ref 12.0–15.0)
MCH: 25.4 pg — ABNORMAL LOW (ref 26.0–34.0)
MCHC: 31.3 g/dL (ref 30.0–36.0)
MCV: 81.1 fL (ref 80.0–100.0)
Platelets: 360 10*3/uL (ref 150–400)
RBC: 2.64 MIL/uL — ABNORMAL LOW (ref 3.87–5.11)
RDW: 16.8 % — ABNORMAL HIGH (ref 11.5–15.5)
WBC: 2.9 10*3/uL — ABNORMAL LOW (ref 4.0–10.5)
nRBC: 0 % (ref 0.0–0.2)

## 2023-05-30 LAB — HCG, SERUM, QUALITATIVE: Preg, Serum: NEGATIVE

## 2023-05-30 MED ORDER — SODIUM CHLORIDE 0.9 % IV SOLN
100.0000 mg | Freq: Once | INTRAVENOUS | Status: AC
  Administered 2023-05-30: 100 mg via INTRAVENOUS
  Filled 2023-05-30: qty 5

## 2023-05-30 MED ORDER — FERROUS SULFATE 325 (65 FE) MG PO TABS: 325.0000 mg | ORAL_TABLET | Freq: Every day | ORAL | 0 refills | Status: AC

## 2023-05-30 NOTE — Discharge Instructions (Addendum)
You were seen today for vaginal bleeding.  We checked your blood counts and they are low below the threshold that we would recommend a blood transfusion.  Since blood transfusion is against your beliefs we did not give a transfusion today.  We were able to give you IV iron to help encourage your body to make new red blood cells.  You need to continue taking iron as an outpatient to try to recover your blood counts.  I sent a prescription to your preferred pharmacy.   You also need to start the norethindrone that Dr. Jackelyn Knife prescribed you.  You can discuss how long this needs to be taken when you see gynecology in the office next week.  Their office is working on getting you an appointment and will call you to let you know when it is.  If you do not hear from them by Monday afternoon, please call their office to make sure an appointment is scheduled.   If you experience worsening bleeding continue to bleed enough to saturate greater than 1 pad every 2 hours, you need to return to the emergency department immediately.  If you develop chest pain, shortness of breath, lightheadedness or dizziness, or passing out, you also need to return.

## 2023-05-30 NOTE — ED Triage Notes (Signed)
Reports has been on cycle for 3 weeks that is heavy with clots.  Reports going through 3 pads an hour.  Patient reports having some lightheadedness and dizziness at times.  Hx of leiomyoma

## 2023-05-30 NOTE — Consult Note (Signed)
Reason for Consult: AUB Referring Physician: Dr. Nonnie Done, MD  Kelly Joyce is an 47 y.o. female [phone] who presents to the ED for heavy menstrual bleeding. Since 10/19 she has been bleeding daily with passage of large blood clots. She asymptomatic from the blood loss but was concerned due to the amount of bleeding. She was prescribed Aygestin per her GYN Dr. Jackelyn Knife but did not start due to medication anxiety. She has a known history of fibroids, with at least 1 submucosal fibroid and other intramural and subserosal fibroids.   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     Past Medical History:  Diagnosis Date   Anemia    Anxiety    Depression     Past Surgical History:  Procedure Laterality Date   CESAREAN SECTION     FEMUR FRACTURE SURGERY     ORIF TIBIA & FIBULA FRACTURES     WISDOM TOOTH EXTRACTION      History reviewed. No pertinent family history.  Social History:  reports that she quit smoking about 11 years ago. She has never used smokeless tobacco. She reports current alcohol use of about 1.0 standard drink of alcohol per week. She reports that she does not use drugs.  Allergies:  Allergies  Allergen Reactions   Advil [Ibuprofen] Swelling    Tongue and throat swelling, patient is unsure if the reaction was caused by the ibuprofen or the Mirena or combination of both   Mirena [Levonorgestrel] Swelling    Tongue and throat swelling, patient is unsure if the reaction was caused by the Mirena, the ibuprofen or a combination of both   Biotin     unknown    Review of Systems  Blood pressure 111/79, pulse 79, temperature 98 F (36.7 C), temperature source Oral, resp. rate 18, height 5\' 5"  (1.651 m), weight 74.8 kg, SpO2 100%. Physical Exam  Results for orders placed or performed during the hospital encounter of 05/30/23 (from the past 48 hour(s))  Type and screen [facility]     Status: None   Collection Time: 05/30/23  2:17 PM   Result Value Ref Range   ABO/RH(D) O POS    Antibody Screen NEG    Sample Expiration      06/02/2023,2359 Performed at [facility] Lab, [address] [place], [place] [address]   hCG, serum, qualitative     Status: None   Collection Time: 05/30/23  2:20 PM  Result Value Ref Range   Preg, Serum NEGATIVE NEGATIVE    Comment:        THE SENSITIVITY OF THIS METHODOLOGY IS >10 mIU/mL. Performed at [facility], [address] [place], [place] [address]   CBC     Status: Abnormal   Collection Time: 05/30/23  2:20 PM  Result Value Ref Range   WBC 2.9 (L) 4.0 - 10.5 K/uL   RBC 2.64 (L) 3.87 - 5.11 MIL/uL   Hemoglobin 6.7 (LL) 12.0 - 15.0 g/dL    Comment: REPEATED TO VERIFY THIS CRITICAL RESULT HAS VERIFIED AND BEEN CALLED TO ALANA BANKS, RN BY [employer] ON 11 02 2024 AT 1459, AND HAS BEEN READ BACK.     HCT 21.4 (L) 36.0 - 46.0 %   MCV 81.1 80.0 - 100.0 fL   MCH 25.4 (L) 26.0 - 34.0 pg   MCHC 31.3 30.0 - 36.0 g/dL   RDW 47.8 (H) 29.5 - 62.1 %   Platelets 360 150 - 400  K/uL   nRBC 0.0 0.0 - 0.2 %    Comment: Performed at [facility], [address] [place], [place] [address]  Basic metabolic panel     Status: None   Collection Time: 05/30/23  3:41 PM  Result Value Ref Range   Sodium 137 135 - 145 mmol/L   Potassium 3.9 3.5 - 5.1 mmol/L   Chloride 104 98 - 111 mmol/L   CO2 23 22 - 32 mmol/L   Glucose, Bld 84 70 - 99 mg/dL    Comment: Glucose reference range applies only to samples taken after fasting for at least 8 hours.   BUN 9 6 - 20 mg/dL   Creatinine, Ser 6.06 0.44 - 1.00 mg/dL   Calcium 9.0 8.9 - 30.1 mg/dL   GFR, Estimated >60 >10 mL/min    Comment: (NOTE) Calculated using the CKD-EPI Creatinine Equation (2021)    Anion gap 10 5 - 15    Comment: Performed at [facility], [address] [place], [place] [address]    No results found.  Assessment/Plan:  47 yo G3P2012 with AUB-L and ABLA - Bleeding on exam is light. As such I  agree with discharge home with close follow-up. After discussion of R/B, she is agreeable to start Aygestin, which she has filled and has with her. Ultimately, she would benefit from hysteroscopic myomectomy. IV iron prior to discharge, which she is amenable to and PO iron q48 hours. Will schedule f/u with our office this week.  Junius Creamer 05/30/2023

## 2023-05-30 NOTE — ED Provider Notes (Signed)
[place] EMERGENCY DEPARTMENT AT [facility] Provider Note   CSN: [id number] Arrival date & time: 05/30/23  1358     History  Chief Complaint  Patient presents with   Vaginal Bleeding    Kelly Joyce is a 47 y.o. female G2P2 who presents for excessive vaginal bleeding. LMP started 10/19. Been bleeding 3 pads per hour since then. Follows with Dr. Jackelyn Knife [facility]). Not on hormonal meds. Has hx fibroids. No fevers, itching, discharge, urinary symptoms, fatigue, light headedness, chest pain, SOB. Prior to this periods were getting more irregular. Dr. Jackelyn Knife recently prescribed her Norethindrone 5 mg on Thursday after she called in for bleeding; she did not start it yet due to medication anxiety.   The history is provided by the patient and medical records.       Home Medications Prior to Admission medications   Medication Sig Start Date End Date Taking? Authorizing Provider  ferrous sulfate 325 (65 FE) MG tablet Take 1 tablet (325 mg total) by mouth daily. 05/30/23  Yes Karmen Stabs, MD  ALPRAZolam Prudy Feeler) 0.5 MG tablet Take 1 tablet (0.5 mg total) by mouth at bedtime as needed for sleep. Patient not taking: Reported on 10/18/2016 11/18/14   Bethann Berkshire, MD  fluticasone [facility]) 50 MCG/ACT nasal spray Place 2 sprays into both nostrils daily. 06/15/21   Bennie Pierini, FNP      Allergies    Advil [ibuprofen], Mirena [levonorgestrel], and Biotin    Review of Systems   Review of Systems  Physical Exam Updated Vital Signs BP 120/86   Pulse (!) 101   Temp 98.1 F (36.7 C) (Oral)   Resp 16   Ht 5\' 5"  (1.651 m)   Wt 74.8 kg   SpO2 99%   BMI 27.46 kg/m  Physical Exam Exam conducted with a chaperone present.  Constitutional:      Appearance: Normal appearance.  HENT:     Head: Normocephalic and atraumatic.     Nose: Nose normal.     Mouth/Throat:     Mouth: Mucous membranes are moist.  Eyes:     Extraocular Movements: Extraocular  movements intact.     Pupils: Pupils are equal, round, and reactive to light.  Cardiovascular:     Rate and Rhythm: Normal rate and regular rhythm.     Pulses: Normal pulses.     Heart sounds: Normal heart sounds.  Pulmonary:     Effort: Pulmonary effort is normal.     Breath sounds: Normal breath sounds.  Abdominal:     General: There is no distension.     Palpations: Abdomen is soft.     Tenderness: There is no abdominal tenderness. There is no guarding.  Genitourinary:    Comments: Slow dark bleed exiting cervix. No hosing of blood. No vaginal laceration or foreign body. No signs of cervicitis.  Musculoskeletal:     Cervical back: Neck supple.     Right lower leg: No edema.     Left lower leg: No edema.  Skin:    General: Skin is warm.     Capillary Refill: Capillary refill takes less than 2 seconds.  Neurological:     General: No focal deficit present.     Mental Status: She is alert.     Cranial Nerves: No cranial nerve deficit.     Sensory: No sensory deficit.     Motor: No weakness.     ED Results / Procedures / Treatments   Labs (all labs ordered  are listed, but only abnormal results are displayed) Labs Reviewed  CBC - Abnormal; Notable for the following components:      Result Value   WBC 2.9 (*)    RBC 2.64 (*)    Hemoglobin 6.7 (*)    HCT 21.4 (*)    MCH 25.4 (*)    RDW 16.8 (*)    All other components within normal limits  HCG, SERUM, QUALITATIVE  BASIC METABOLIC PANEL  TYPE AND SCREEN    EKG None  Radiology No results found.  Procedures Procedures    Medications Ordered in ED Medications  iron sucrose (VENOFER) 100 mg in sodium chloride 0.9 % 100 mL IVPB (100 mg Intravenous New Bag/Given 05/30/23 1718)    ED Course/ Medical Decision Making/ A&P                                 Medical Decision Making Amount and/or Complexity of Data Reviewed Labs: ordered. Decision-making details documented in ED Course.  Risk OTC  drugs. Prescription drug management.  47 year old female who presents with ongoing significant vaginal bleeding.  Dx considered includes acute life-threatening hemorrhage, perimenopausal bleeding, pregnancy related pathology or abortion, cervical bleed, vaginal laceration, cervicitis.  She is hemodynamically stable from being tachycardic to 101. She is not pale or ill-appearing.  Chaperoned pelvic exam shows no vaginal laceration or signs of cervicitis.  She has a dark slow bleed exiting the cervical os consistent with menstrual bleeding.  No clots or products of conception.  Pregnancy test is negative.  Labs show a hemoglobin of 6.7.  I discussed that this is below the transfusion threshold and qualifies as a life-threatening bleed with the patient.  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  I consulted OB/GYN on-call who evaluated the patient and after shared decision making we will discharge patient with clinic follow-up.  Because she is not amenable to treatment measures offered that could be done as an inpatient, she would not benefit from admission.  She will start the norethindrone 5 mg to stop the bleeding.  She was given IV iron in the ED at request of OB/GYN.  I sent a prescription for continued iron supplementation to her preferred pharmacy which she is agreeable to take.  Discussed direct return precautions.  She was discharged from the ED in stable condition.           Final Clinical Impression(s) / ED Diagnoses Final diagnoses:  Vaginal bleeding    Rx / DC Orders ED Discharge Orders          Ordered    ferrous sulfate 325 (65 FE) MG tablet  Daily        05/30/23 1737              Karmen Stabs, MD 05/30/23 1741    Elayne Snare K, DO 05/30/23 2348

## 2023-06-04 ENCOUNTER — Emergency Department (HOSPITAL_COMMUNITY)
Admission: EM | Admit: 2023-06-04 | Discharge: 2023-06-05 | Disposition: A | Discharge status: Home / Self Care | Payer: BC Managed Care – PPO | Attending: Emergency Medicine | Admitting: Emergency Medicine

## 2023-06-04 ENCOUNTER — Other Ambulatory Visit: Payer: Self-pay

## 2023-06-04 DIAGNOSIS — R519 Headache, unspecified: Secondary | ICD-10-CM | POA: Diagnosis present

## 2023-06-04 DIAGNOSIS — Z87891 Personal history of nicotine dependence: Secondary | ICD-10-CM | POA: Insufficient documentation

## 2023-06-04 NOTE — ED Triage Notes (Signed)
Patient reports headache onset yesterday , denies head injury , no photophobia or emesis , patient stated that she is taking Rx medication for vaginal bleeding /fibroids this week . Refused analgesic at triage.

## 2023-06-05 ENCOUNTER — Emergency Department (HOSPITAL_COMMUNITY): Payer: BC Managed Care – PPO

## 2023-06-05 LAB — COMPREHENSIVE METABOLIC PANEL
ALT: 15 U/L (ref 0–44)
AST: 16 U/L (ref 15–41)
Albumin: 3.6 g/dL (ref 3.5–5.0)
Alkaline Phosphatase: 54 U/L (ref 38–126)
Anion gap: 9 (ref 5–15)
BUN: 12 mg/dL (ref 6–20)
CO2: 21 mmol/L — ABNORMAL LOW (ref 22–32)
Calcium: 9.6 mg/dL (ref 8.9–10.3)
Chloride: 106 mmol/L (ref 98–111)
Creatinine, Ser: 0.74 mg/dL (ref 0.44–1.00)
GFR, Estimated: >60 mL/min (ref >60)
Glucose, Bld: 92 mg/dL (ref 70–99)
Potassium: 3.5 mmol/L (ref 3.5–5.1)
Sodium: 136 mmol/L (ref 135–145)
Total Bilirubin: 0.3 mg/dL (ref <1.2)
Total Protein: 7 g/dL (ref 6.5–8.1)

## 2023-06-05 LAB — CBC WITH DIFFERENTIAL/PLATELET
Abs Immature Granulocytes: 0.03 10*3/uL (ref 0.00–0.07)
Basophils Absolute: 0 10*3/uL (ref 0.0–0.1)
Basophils Relative: 0 %
Eosinophils Absolute: 0.1 10*3/uL (ref 0.0–0.5)
Eosinophils Relative: 2 %
HCT: 23.8 % — ABNORMAL LOW (ref 36.0–46.0)
Hemoglobin: 7.4 g/dL — ABNORMAL LOW (ref 12.0–15.0)
Immature Granulocytes: 0 %
Lymphocytes Relative: 30 %
Lymphs Abs: 2.1 10*3/uL (ref 0.7–4.0)
MCH: 26.1 pg (ref 26.0–34.0)
MCHC: 31.1 g/dL (ref 30.0–36.0)
MCV: 84.1 fL (ref 80.0–100.0)
Monocytes Absolute: 0.6 10*3/uL (ref 0.1–1.0)
Monocytes Relative: 8 %
Neutro Abs: 4.2 10*3/uL (ref 1.7–7.7)
Neutrophils Relative %: 60 %
Platelets: 490 10*3/uL — ABNORMAL HIGH (ref 150–400)
RBC: 2.83 MIL/uL — ABNORMAL LOW (ref 3.87–5.11)
RDW: 19.9 % — ABNORMAL HIGH (ref 11.5–15.5)
WBC: 7.1 10*3/uL (ref 4.0–10.5)
nRBC: 0.4 % — ABNORMAL HIGH (ref 0.0–0.2)

## 2023-06-05 LAB — HCG, SERUM, QUALITATIVE: Preg, Serum: NEGATIVE

## 2023-06-05 MED ORDER — SODIUM CHLORIDE 0.9 % IV BOLUS
1000.0000 mL | Freq: Once | INTRAVENOUS | Status: AC
  Administered 2023-06-05: 1000 mL via INTRAVENOUS

## 2023-06-05 MED ORDER — PROCHLORPERAZINE EDISYLATE 10 MG/2ML IJ SOLN
5.0000 mg | Freq: Once | INTRAMUSCULAR | Status: AC
  Administered 2023-06-05: 5 mg via INTRAVENOUS

## 2023-06-05 MED ORDER — PROCHLORPERAZINE EDISYLATE 10 MG/2ML IJ SOLN
10.0000 mg | Freq: Once | INTRAMUSCULAR | Status: DC
  Filled 2023-06-05: qty 2

## 2023-06-05 MED ORDER — DIPHENHYDRAMINE HCL 50 MG/ML IJ SOLN
25.0000 mg | Freq: Once | INTRAMUSCULAR | Status: AC
  Administered 2023-06-05: 25 mg via INTRAVENOUS
  Filled 2023-06-05: qty 1

## 2023-06-05 MED ORDER — IOHEXOL 350 MG/ML SOLN
75.0000 mL | Freq: Once | INTRAVENOUS | Status: AC | PRN
  Administered 2023-06-05: 75 mL via INTRAVENOUS

## 2023-06-05 NOTE — ED Provider Notes (Signed)
[facility] Emergency Department Provider Note MRN:  [id number]  Arrival date & time: 06/05/23     Chief Complaint   Headache   History of Present Illness   Kelly Joyce is a 47 y.o. year-old female with a history of fibroids presenting to the ED with chief complaint of headache.  Headache for a week ever since starting a new medicine for vaginal bleeding.  Getting worse, now worst headache of life.  Worse when standing.  Review of Systems  A thorough review of systems was obtained and all systems are negative except as noted in the HPI and PMH.   Patient's Health History    Past Medical History:  Diagnosis Date   Anemia    Anxiety    Depression     Past Surgical History:  Procedure Laterality Date   CESAREAN SECTION     FEMUR FRACTURE SURGERY     ORIF TIBIA & FIBULA FRACTURES     WISDOM TOOTH EXTRACTION      No family history on file.  Social History   Socioeconomic History   Marital status: Married    Spouse name: Not on file   Number of children: Not on file   Years of education: Not on file   Highest education level: Not on file  Occupational History   Not on file  Tobacco Use   Smoking status: Former    Current packs/day: 0.00    Types: Cigarettes    Quit date: 04/30/2012    Years since quitting: 11.1   Smokeless tobacco: Never  Substance and Sexual Activity   Alcohol use: Yes    Alcohol/week: 1.0 standard drink of alcohol    Types: 1 Glasses of wine per week   Drug use: No   Sexual activity: Yes    Birth control/protection: None  Other Topics Concern   Not on file  Social History Narrative   Not on file   Social Determinants of Health   Financial Resource Strain: Not on file  Food Insecurity: Not on file  Transportation Needs: Not on file  Physical Activity: Not on file  Stress: Not on file  Social Connections: Unknown (12/10/2021)   Received from [facility]   Social Network    Social Network: Not on file   Intimate Partner Violence: Unknown (11/01/2021)   Received from [facility]   HITS    Physically Hurt: Not on file    Insult or Talk Down To: Not on file    Threaten Physical Harm: Not on file    Scream or Curse: Not on file     Physical Exam   Vitals:   06/05/23 0149 06/05/23 0400  BP: 110/81 (!) 102/58  Pulse: 81 72  Resp: 16 18  Temp: 98.5 F (36.9 C) 98.3 F (36.8 C)  SpO2: 100% 100%    CONSTITUTIONAL:  well-appearing, NAD NEURO/PSYCH:  Alert and oriented x 3, normal and symmetric strength and sensation, normal coordination, normal speech EYES:  eyes equal and reactive ENT/NECK:  no LAD, no JVD CARDIO:  regular rate, well-perfused, normal S1 and S2 PULM:  CTAB no wheezing or rhonchi GI/GU:  non-distended, non-tender MSK/SPINE:  No gross deformities, no edema SKIN:  no rash, atraumatic   *Additional and/or pertinent findings included in MDM below  Diagnostic and Interventional Summary    EKG Interpretation Date/Time:    Ventricular Rate:    PR Interval:    QRS Duration:    QT Interval:    QTC  Calculation:   R Axis:      Text Interpretation:         Labs Reviewed  CBC WITH DIFFERENTIAL/PLATELET - Abnormal; Notable for the following components:      Result Value   RBC 2.83 (*)    Hemoglobin 7.4 (*)    HCT 23.8 (*)    RDW 19.9 (*)    Platelets 490 (*)    nRBC 0.4 (*)    All other components within normal limits  COMPREHENSIVE METABOLIC PANEL - Abnormal; Notable for the following components:   CO2 21 (*)    All other components within normal limits  HCG, SERUM, QUALITATIVE    CT ANGIO HEAD NECK W WO CM  Final Result    CT VENOGRAM HEAD  Final Result      Medications  diphenhydrAMINE (BENADRYL) injection 25 mg (25 mg Intravenous Given 06/05/23 0233)  prochlorperazine (COMPAZINE) injection 5 mg (5 mg Intravenous Given 06/05/23 0233)  sodium chloride 0.9 % bolus 1,000 mL (0 mLs Intravenous Stopped 06/05/23 0413)  iohexol (OMNIPAQUE) 350 MG/ML  injection 75 mL (75 mLs Intravenous Contrast Given 06/05/23 0259)     Procedures  /  Critical Care Procedures  ED Course and Medical Decision Making  Initial Impression and Ddx Patient endorsing new positional headache after starting a hormonal medicine administered or prescribed by OB/GYN.  Getting worse and worse.  Reassuring neurological exam.  Dural venous thrombus is considered as is intracranial mass or bleeding.  Per chart review she was started on estrogen to help with her bleeding, she is scheduled for [facility] with OB/GYN.  She is also Jehovah's Witness and recently declined a blood transfusion.  Past medical/surgical history that increases complexity of ED encounter: Fibroids with recent anemia  Interpretation of Diagnostics I personally reviewed the laboratory assessment and my interpretation is as follows: Anemia similar to prior value, otherwise no significant blood count electrolyte disturbance  CT imaging is reassuring, no dural venous thrombus, no subarachnoid.  Patient Reassessment and Ultimate Disposition/Management     Patient feeling a lot better, appropriate for discharge.  Patient management required discussion with the following services or consulting groups:  None  Complexity of Problems Addressed Acute illness or injury that poses threat of life of bodily function  Additional Data Reviewed and Analyzed Further history obtained from: Prior labs/imaging results  Additional Factors Impacting ED Encounter Risk None  Elmer Sow. Pilar Plate, MD [facility] Emergency Medicine [facility] [email or web address]  Final Clinical Impressions(s) / ED Diagnoses     ICD-10-CM   1. Bad headache  R51.9       ED Discharge Orders     None        Discharge Instructions Discussed with and Provided to Patient:     Discharge Instructions      You were evaluated in the Emergency Department and after careful evaluation, we did not find any emergent  condition requiring admission or further testing in the hospital.  Your exam/testing today is overall reassuring.  Recommend follow-up with your regular doctors and OB/GYN to discuss your symptoms.  Please return to the Emergency Department if you experience any worsening of your condition.   Thank you for allowing [place] to be a part of your care.       Sabas Sous, MD 06/05/23 [phone]

## 2023-06-05 NOTE — Discharge Instructions (Signed)
You were evaluated in the Emergency Department and after careful evaluation, we did not find any emergent condition requiring admission or further testing in the hospital.  Your exam/testing today is overall reassuring.  Recommend follow-up with your regular doctors and OB/GYN to discuss your symptoms.  Please return to the Emergency Department if you experience any worsening of your condition.   Thank you for allowing [place] to be a part of your care.

## 2023-06-09 ENCOUNTER — Other Ambulatory Visit: Payer: Self-pay

## 2023-06-09 ENCOUNTER — Encounter (HOSPITAL_BASED_OUTPATIENT_CLINIC_OR_DEPARTMENT_OTHER): Payer: Self-pay | Admitting: Obstetrics and Gynecology

## 2023-06-09 NOTE — Progress Notes (Signed)
Spoke w/ via phone for pre-op interview---pt Lab needs dos---- cbc, t & s, urine preg        Lab results------ COVID test -----patient states asymptomatic no test needed Arrive at -------940 06-18-2023 NPO after MN NO Solid Food.  Clear liquids from MN until---840 Med rec completed Medications to take morning of surgery -----norethidrone Diabetic medication -----n/a Patient instructed no nail polish to be worn day of surgery Patient instructed to bring photo id and insurance card day of surgery Patient aware to have Driver (ride ) / caregiver   husband Kelly Joyce  for 24 hours after surgery -  Patient Special Instructions -----none Pre-Op special Instructions -----none Patient verbalized understanding of instructions that were given at this phone interview. Patient denies chest pain, sob, fever, cough at the interview.

## 2023-06-13 ENCOUNTER — Emergency Department (HOSPITAL_BASED_OUTPATIENT_CLINIC_OR_DEPARTMENT_OTHER)
Admission: EM | Admit: 2023-06-13 | Discharge: 2023-06-13 | Disposition: A | Discharge status: Home / Self Care | Payer: BC Managed Care – PPO | Attending: Emergency Medicine | Admitting: Emergency Medicine

## 2023-06-13 ENCOUNTER — Encounter (HOSPITAL_BASED_OUTPATIENT_CLINIC_OR_DEPARTMENT_OTHER): Payer: Self-pay

## 2023-06-13 DIAGNOSIS — M79632 Pain in left forearm: Secondary | ICD-10-CM | POA: Insufficient documentation

## 2023-06-13 MED ORDER — CEPHALEXIN 500 MG PO CAPS: 500.0000 mg | ORAL_CAPSULE | Freq: Two times a day (BID) | ORAL | 0 refills | Status: AC

## 2023-06-13 NOTE — ED Triage Notes (Signed)
She c/o an area of soreness and swelling at distal left forearm. She tells me that she has had recent procedures; and had an IV in that area within the last two weeks. She denies fever, and is in no distress.

## 2023-06-13 NOTE — Discharge Instructions (Addendum)
You have been seen today for your complaint of left forearm pain. Your discharge medications include Keflex. This is an antibiotic. You should take it as prescribed. You should take it for the entire duration of the prescription. This may cause an upset stomach. This is normal. You may take this with food. You may also eat yogurt to prevent diarrhea. Follow up with: Your primary care provider in 1 week for reevaluation Please seek immediate medical care if you develop any of the following symptoms: You notice red streaks coming from the infected area. You notice the skin turns purple or black and falls off. At this time there does not appear to be the presence of an emergent medical condition, however there is always the potential for conditions to change. Please read and follow the below instructions.  Do not take your medicine if  develop an itchy rash, swelling in your mouth or lips, or difficulty breathing; call 911 and seek immediate emergency medical attention if this occurs.  You may review your lab tests and imaging results in their entirety on your MyChart account.  Please discuss all results of fully with your primary care provider and other specialist at your follow-up visit.  Note: Portions of this text may have been transcribed using voice recognition software. Every effort was made to ensure accuracy; however, inadvertent computerized transcription errors may still be present.

## 2023-06-13 NOTE — ED Provider Notes (Signed)
[place] EMERGENCY DEPARTMENT AT [facility] Provider Note   CSN: [id number] Arrival date & time: 06/13/23  1027     History  Chief Complaint  Patient presents with   Arm Pain    Kelly Joyce is a 47 y.o. female.  With a history of anxiety, depression, anemia presenting to the ED for evaluation of left forearm pain.  She noticed this pain approximately 6 days ago.  Is localized to the lateral aspect of the left forearm.  She presented to her primary care provider who told her to apply warm compresses.  She has been doing this but states that the swelling has not improved.  She reports a mild aching pain in this area as well.  She states she has had multiple needle sticks in the left [facility] due to recent office visits and is concerned about a DVT.  She denies any pain to any other portion of her arm.  No trauma.  No fevers or chills.   Arm Pain       Home Medications Prior to Admission medications   Medication Sig Start Date End Date Taking? Authorizing Provider  cephALEXin (KEFLEX) 500 MG capsule Take 1 capsule (500 mg total) by mouth 2 (two) times daily for 7 days. 06/13/23 06/20/23 Yes Chandler Stofer, Edsel Petrin, PA-C  ferrous sulfate 325 (65 FE) MG tablet Take 1 tablet (325 mg total) by mouth daily. Patient taking differently: Take 325 mg by mouth every other day. 05/30/23   Karmen Stabs, MD  norethindrone (AYGESTIN) 5 MG tablet Take 5 mg by mouth daily.    [provider]      Allergies    Advil [ibuprofen], Biotin, Mirena [levonorgestrel], and Other    Review of Systems   Review of Systems  Musculoskeletal:  Positive for myalgias.  All other systems reviewed and are negative.   Physical Exam Updated Vital Signs BP 121/80 (BP Location: Right Arm)   Pulse 69   Temp 98.7 F (37.1 C)   Resp 16   LMP 05/29/2023 (Approximate)   SpO2 100%  Physical Exam Vitals and nursing note reviewed.  Constitutional:      General: She is not in acute distress.     Appearance: Normal appearance. She is normal weight. She is not ill-appearing.  HENT:     Head: Normocephalic and atraumatic.  Pulmonary:     Effort: Pulmonary effort is normal. No respiratory distress.  Abdominal:     General: Abdomen is flat.  Musculoskeletal:        General: Normal range of motion.     Cervical back: Neck supple.     Comments: Mild swelling to left lateral forearm on the volar surface.  6 cm by 3 cm area of mild erythema.  No induration.  No streaking.  Skin:    General: Skin is warm and dry.     Comments: Old puncture marks to left AC fossa.  No surrounding erythema.  Neurological:     Mental Status: She is alert and oriented to person, place, and time.  Psychiatric:        Mood and Affect: Mood normal.        Behavior: Behavior normal.     ED Results / Procedures / Treatments   Labs (all labs ordered are listed, but only abnormal results are displayed) Labs Reviewed - No data to display  EKG None  Radiology No results found.  Procedures Procedures    Medications Ordered in ED Medications - No data  to display  ED Course/ Medical Decision Making/ A&P                                 Medical Decision Making This patient presents to the ED for concern of left forearm pain, this involves an extensive number of treatment options, and is a complaint that carries with it a high risk of complications and morbidity.  The differential diagnosis includes cellulitis, superficial thrombophlebitis, hematoma  Additional history obtained from: Nursing notes from this visit.  Afebrile, hemodynamically stable.  47 year old female presenting to the ED for evaluation of left distal forearm pain.  Symptoms have been present for the past 6 days.  Not improved with warm compresses.  On exam, there is mild erythema to the left distal forearm.  Overall reassuring exam otherwise.  Will treat for potential cellulitis with Keflex.  Will have her follow-up with her primary  care provider in 1 week for reevaluation.  She was given return precautions.  Stable at discharge.  At this time there does not appear to be any evidence of an acute emergency medical condition and the patient appears stable for discharge with appropriate outpatient follow up. Diagnosis was discussed with patient who verbalizes understanding of care plan and is agreeable to discharge. I have discussed return precautions with patient who verbalizes understanding. Patient encouraged to follow-up with their PCP within 1 week. All questions answered.  Note: Portions of this report may have been transcribed using voice recognition software. Every effort was made to ensure accuracy; however, inadvertent computerized transcription errors may still be present.        Final Clinical Impression(s) / ED Diagnoses Final diagnoses:  Left forearm pain    Rx / DC Orders ED Discharge Orders          Ordered    cephALEXin (KEFLEX) 500 MG capsule  2 times daily        06/13/23 1132              Michelle Piper, Cordelia Poche 06/13/23 1140    Benjiman Core, MD 06/13/23 1515

## 2023-06-16 NOTE — Anesthesia Preprocedure Evaluation (Addendum)
Anesthesia Evaluation  Patient identified by MRN, date of birth, ID band Patient awake    Reviewed: Allergy & Precautions, NPO status , Patient's Chart, lab work & pertinent test results  Airway Mallampati: II  TM Distance: >3 FB Neck ROM: Full    Dental  (+) Teeth Intact, Dental Advisory Given   Pulmonary former smoker   Pulmonary exam normal breath sounds clear to auscultation       Cardiovascular negative cardio ROS Normal cardiovascular exam Rhythm:Regular Rate:Normal     Neuro/Psych  PSYCHIATRIC DISORDERS Anxiety Depression    negative neurological ROS     GI/Hepatic negative GI ROS, Neg liver ROS,,,  Endo/Other  negative endocrine ROS    Renal/GU negative Renal ROS  negative genitourinary   Musculoskeletal negative musculoskeletal ROS (+)    Abdominal   Peds  Hematology  (+) Blood dyscrasia, anemia , REFUSES BLOOD PRODUCTSHb 7.4   Anesthesia Other Findings   Reproductive/Obstetrics Myoma, menorrhagia                              Anesthesia Physical Anesthesia Plan  ASA: 2  Anesthesia Plan: General   Post-op Pain Management: Tylenol PO (pre-op)* and Toradol IV (intra-op)*   Induction: Intravenous  PONV Risk Score and Plan: 4 or greater and Ondansetron, Dexamethasone, Midazolam and Treatment may vary due to age or medical condition  Airway Management Planned: LMA  Additional Equipment: None  Intra-op Plan:   Post-operative Plan: Extubation in OR  Informed Consent: I have reviewed the patients History and Physical, chart, labs and discussed the procedure including the risks, benefits and alternatives for the proposed anesthesia with the patient or authorized representative who has indicated his/her understanding and acceptance.     Dental advisory given  Plan Discussed with: CRNA  Anesthesia Plan Comments:        Anesthesia Quick Evaluation

## 2023-06-17 NOTE — H&P (Signed)
Kelly Joyce is an 47 y.o. female. She has known myomas, ultrasound 06/2022 with at least 4 measurable myomas, at least one of which is significantly submucosal.  She has declined treatment to this point.  However she was recently seen in the ED due to increased bleeding, hemoglobin 6.7 and received IV iron.  She has also agreed to start on aygestin to help reduce bleeding.  She has also agreed to proceed with hysteroscopic resection of submucosal myoma  Pertinent Gynecological History: Last mammogram: normal Date: 06/2022 Last pap: normal Date: 2019 OB History: G3, P2012 SVD, LTCS   Menstrual History: Patient's last menstrual period was 05/16/2023.    Past Medical History:  Diagnosis Date   Anemia    Anxiety    Depression     Past Surgical History:  Procedure Laterality Date   CESAREAN SECTION  2002   FEMUR FRACTURE SURGERY Left 2004   ORIF TIBIA & FIBULA FRACTURES Right 2004   WISDOM TOOTH EXTRACTION      History reviewed. No pertinent family history.  Social History:  reports that she quit smoking about 11 years ago. Her smoking use included cigarettes. She has never used smokeless tobacco. She reports that she does not currently use alcohol after a past usage of about 1.0 standard drink of alcohol per week. She reports that she does not use drugs.  Allergies:  Allergies  Allergen Reactions   Advil [Ibuprofen] Swelling    Tongue and throat swelling, patient is unsure if the reaction was caused by the ibuprofen or the Mirena or combination of both   Biotin Anaphylaxis   Mirena [Levonorgestrel] Swelling    Tongue and throat swelling, patient is unsure if the reaction was caused by the Mirena, the ibuprofen or a combination of both   Other     Blood products refusal    No medications prior to admission.    Review of Systems  Respiratory: Negative.    Cardiovascular: Negative.     Last menstrual period 05/16/2023. Physical Exam Constitutional:      Appearance:  Normal appearance.  Cardiovascular:     Rate and Rhythm: Normal rate and regular rhythm.     Heart sounds: Normal heart sounds.  Pulmonary:     Effort: Pulmonary effort is normal. No respiratory distress.     Breath sounds: Normal breath sounds. No wheezing.  Abdominal:     General: There is no distension.     Palpations: Abdomen is soft. There is no mass.     Tenderness: There is no abdominal tenderness.  Genitourinary:    General: Normal vulva.     Comments: Uterus about 10 weeks size No adnexal mass Musculoskeletal:     Cervical back: Normal range of motion and neck supple.  Neurological:     Mental Status: She is alert.     No results found for this or any previous visit (from the past 24 hour(s)).  No results found.  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  All medical and surgical options discussed.  Surgical procedure, risks, alternatives, chances of relieving symptoms all discussed, questions answered.  Will admit for hysteroscopy, D&C, Myosure resection of submucosal myoma  Kelly Joyce 06/17/2023, 7:21 PM

## 2023-06-17 NOTE — Progress Notes (Signed)
On 06-17-2023 Received voicemail message from patient this afternoon @ 1422.  to inform us that she started an antibiotic 06-13-2023 and to add to her medication list cephalexin.  This is already on patient medication list , prescription given to patient from ED for probable left forearm cellulitis.  Pt surgery is on 06-18-2023. Patient she did not need a return phone call unless we had question's.

## 2023-06-18 ENCOUNTER — Encounter (HOSPITAL_BASED_OUTPATIENT_CLINIC_OR_DEPARTMENT_OTHER)
Admission: RE | Disposition: A | Discharge status: Home / Self Care | Payer: Self-pay | Source: Home / Self Care | Attending: Obstetrics and Gynecology

## 2023-06-18 ENCOUNTER — Other Ambulatory Visit: Payer: Self-pay

## 2023-06-18 ENCOUNTER — Ambulatory Visit (HOSPITAL_BASED_OUTPATIENT_CLINIC_OR_DEPARTMENT_OTHER)
Admission: RE | Admit: 2023-06-18 | Discharge: 2023-06-18 | Disposition: A | Discharge status: Home / Self Care | Payer: BC Managed Care – PPO | Attending: Obstetrics and Gynecology | Admitting: Obstetrics and Gynecology

## 2023-06-18 ENCOUNTER — Ambulatory Visit (HOSPITAL_BASED_OUTPATIENT_CLINIC_OR_DEPARTMENT_OTHER): Payer: Self-pay | Admitting: Anesthesiology

## 2023-06-18 ENCOUNTER — Encounter (HOSPITAL_BASED_OUTPATIENT_CLINIC_OR_DEPARTMENT_OTHER): Payer: Self-pay | Admitting: Obstetrics and Gynecology

## 2023-06-18 DIAGNOSIS — D649 Anemia, unspecified: Secondary | ICD-10-CM | POA: Diagnosis not present

## 2023-06-18 DIAGNOSIS — D250 Submucous leiomyoma of uterus: Secondary | ICD-10-CM | POA: Diagnosis not present

## 2023-06-18 DIAGNOSIS — Z01818 Encounter for other preprocedural examination: Secondary | ICD-10-CM

## 2023-06-18 DIAGNOSIS — N921 Excessive and frequent menstruation with irregular cycle: Secondary | ICD-10-CM | POA: Insufficient documentation

## 2023-06-18 DIAGNOSIS — Z87891 Personal history of nicotine dependence: Secondary | ICD-10-CM | POA: Diagnosis not present

## 2023-06-18 HISTORY — PX: DILATATION & CURETTAGE/HYSTEROSCOPY WITH MYOSURE: SHX6511

## 2023-06-18 LAB — CBC
HCT: 26.9 % — ABNORMAL LOW (ref 36.0–46.0)
Hemoglobin: 8.3 g/dL — ABNORMAL LOW (ref 12.0–15.0)
MCH: 25.6 pg — ABNORMAL LOW (ref 26.0–34.0)
MCHC: 30.9 g/dL (ref 30.0–36.0)
MCV: 83 fL (ref 80.0–100.0)
Platelets: 528 10*3/uL — ABNORMAL HIGH (ref 150–400)
RBC: 3.24 MIL/uL — ABNORMAL LOW (ref 3.87–5.11)
RDW: 18.5 % — ABNORMAL HIGH (ref 11.5–15.5)
WBC: 3.4 10*3/uL — ABNORMAL LOW (ref 4.0–10.5)
nRBC: 0 % (ref 0.0–0.2)

## 2023-06-18 LAB — POCT PREGNANCY, URINE: Preg Test, Ur: NEGATIVE

## 2023-06-18 SURGERY — DILATATION & CURETTAGE/HYSTEROSCOPY WITH MYOSURE
Anesthesia: General | Site: Uterus

## 2023-06-18 MED ORDER — OXYCODONE HCL 5 MG PO TABS
5.0000 mg | ORAL_TABLET | Freq: Once | ORAL | Status: AC | PRN
  Administered 2023-06-18: 5 mg via ORAL

## 2023-06-18 MED ORDER — PROPOFOL 10 MG/ML IV BOLUS
INTRAVENOUS | Status: DC | PRN
  Administered 2023-06-18: 200 mg via INTRAVENOUS
  Administered 2023-06-18: 100 mg via INTRAVENOUS

## 2023-06-18 MED ORDER — MEPERIDINE HCL 25 MG/ML IJ SOLN: 6.2500 mg | INTRAMUSCULAR | Status: DC | PRN

## 2023-06-18 MED ORDER — DEXAMETHASONE SODIUM PHOSPHATE 10 MG/ML IJ SOLN
INTRAMUSCULAR | Status: DC | PRN
  Administered 2023-06-18: 4 mg via INTRAVENOUS

## 2023-06-18 MED ORDER — MIDAZOLAM HCL 2 MG/2ML IJ SOLN
INTRAMUSCULAR | Status: DC | PRN
  Administered 2023-06-18: 2 mg via INTRAVENOUS

## 2023-06-18 MED ORDER — FENTANYL CITRATE (PF) 100 MCG/2ML IJ SOLN
INTRAMUSCULAR | Status: DC | PRN
  Administered 2023-06-18 (×2): 50 ug via INTRAVENOUS

## 2023-06-18 MED ORDER — ONDANSETRON HCL 4 MG/2ML IJ SOLN: 4.0000 mg | Freq: Once | INTRAMUSCULAR | Status: DC | PRN

## 2023-06-18 MED ORDER — ACETAMINOPHEN 500 MG PO TABS: 1000.0000 mg | ORAL_TABLET | Freq: Once | ORAL | Status: DC

## 2023-06-18 MED ORDER — KETOROLAC TROMETHAMINE 30 MG/ML IJ SOLN
INTRAMUSCULAR | Status: AC
  Filled 2023-06-18: qty 1

## 2023-06-18 MED ORDER — DEXMEDETOMIDINE HCL IN NACL 400 MCG/100ML IV SOLN
INTRAVENOUS | Status: DC | PRN
  Administered 2023-06-18: 8 ug via INTRAVENOUS

## 2023-06-18 MED ORDER — LACTATED RINGERS IV SOLN: INTRAVENOUS | Status: DC

## 2023-06-18 MED ORDER — PHENYLEPHRINE 80 MCG/ML (10ML) SYRINGE FOR IV PUSH (FOR BLOOD PRESSURE SUPPORT)
PREFILLED_SYRINGE | INTRAVENOUS | Status: DC | PRN
Start: 2023-06-18 — End: 2023-06-18
  Administered 2023-06-18 (×5): 160 ug via INTRAVENOUS

## 2023-06-18 MED ORDER — EPHEDRINE SULFATE-NACL 50-0.9 MG/10ML-% IV SOSY
PREFILLED_SYRINGE | INTRAVENOUS | Status: DC | PRN
Start: 2023-06-18 — End: 2023-06-18
  Administered 2023-06-18: 10 mg via INTRAVENOUS

## 2023-06-18 MED ORDER — ONDANSETRON HCL 4 MG/2ML IJ SOLN
INTRAMUSCULAR | Status: DC | PRN
  Administered 2023-06-18: 4 mg via INTRAVENOUS

## 2023-06-18 MED ORDER — MIDAZOLAM HCL 2 MG/2ML IJ SOLN
INTRAMUSCULAR | Status: AC
  Filled 2023-06-18: qty 2

## 2023-06-18 MED ORDER — FENTANYL CITRATE (PF) 100 MCG/2ML IJ SOLN
INTRAMUSCULAR | Status: AC
  Filled 2023-06-18: qty 2

## 2023-06-18 MED ORDER — HYDROMORPHONE HCL 1 MG/ML IJ SOLN
0.2500 mg | INTRAMUSCULAR | Status: DC | PRN
  Administered 2023-06-18: 0.25 mg via INTRAVENOUS

## 2023-06-18 MED ORDER — LIDOCAINE HCL (PF) 2 % IJ SOLN
INTRAMUSCULAR | Status: AC
  Filled 2023-06-18: qty 5

## 2023-06-18 MED ORDER — SODIUM CHLORIDE 0.9 % IR SOLN
Status: DC | PRN
  Administered 2023-06-18 (×2): 3000 mL

## 2023-06-18 MED ORDER — ACETAMINOPHEN 10 MG/ML IV SOLN
INTRAVENOUS | Status: AC
  Filled 2023-06-18: qty 100

## 2023-06-18 MED ORDER — ALBUMIN HUMAN 5 % IV SOLN: INTRAVENOUS | Status: DC | PRN

## 2023-06-18 MED ORDER — LIDOCAINE HCL 2 % IJ SOLN
INTRAMUSCULAR | Status: DC | PRN
  Administered 2023-06-18: 10 mL

## 2023-06-18 MED ORDER — LIDOCAINE 2% (20 MG/ML) 5 ML SYRINGE
INTRAMUSCULAR | Status: DC | PRN
  Administered 2023-06-18: 60 mg via INTRAVENOUS

## 2023-06-18 MED ORDER — OXYCODONE HCL 5 MG PO TABS
ORAL_TABLET | ORAL | Status: AC
  Filled 2023-06-18: qty 1

## 2023-06-18 MED ORDER — KETOROLAC TROMETHAMINE 30 MG/ML IJ SOLN: 30.0000 mg | Freq: Once | INTRAMUSCULAR | Status: DC | PRN

## 2023-06-18 MED ORDER — HYDROMORPHONE HCL 1 MG/ML IJ SOLN
INTRAMUSCULAR | Status: AC
  Filled 2023-06-18: qty 1

## 2023-06-18 MED ORDER — PROPOFOL 10 MG/ML IV BOLUS
INTRAVENOUS | Status: AC
  Filled 2023-06-18: qty 20

## 2023-06-18 MED ORDER — ACETAMINOPHEN 500 MG PO TABS
ORAL_TABLET | ORAL | Status: AC
  Filled 2023-06-18: qty 2

## 2023-06-18 MED ORDER — OXYCODONE HCL 5 MG/5ML PO SOLN: 5.0000 mg | Freq: Once | ORAL | Status: AC | PRN

## 2023-06-18 MED ORDER — DEXAMETHASONE SODIUM PHOSPHATE 10 MG/ML IJ SOLN
INTRAMUSCULAR | Status: AC
  Filled 2023-06-18: qty 1

## 2023-06-18 MED ORDER — AMISULPRIDE (ANTIEMETIC) 5 MG/2ML IV SOLN: 10.0000 mg | Freq: Once | INTRAVENOUS | Status: DC | PRN

## 2023-06-18 SURGICAL SUPPLY — 24 items
CATH ROBINSON RED A/P 16FR (CATHETERS) ×1 IMPLANT
DEVICE MYOSURE LITE (MISCELLANEOUS) IMPLANT
DEVICE MYOSURE REACH (MISCELLANEOUS) IMPLANT
DILATOR CANAL MILEX (MISCELLANEOUS) IMPLANT
DRSG TELFA 3X8 NADH STRL (GAUZE/BANDAGES/DRESSINGS) ×1 IMPLANT
ELECT REM PT RETURN 9FT ADLT (ELECTROSURGICAL)
ELECTRODE REM PT RTRN 9FT ADLT (ELECTROSURGICAL) IMPLANT
GAUZE 4X4 16PLY ~~LOC~~+RFID DBL (SPONGE) ×2 IMPLANT
GLOVE ORTHO TXT STRL SZ7.5 (GLOVE) ×1 IMPLANT
GOWN STRL REUS W/TWL LRG LVL3 (GOWN DISPOSABLE) ×1 IMPLANT
IV NS IRRIG 3000ML ARTHROMATIC (IV SOLUTION) ×2 IMPLANT
KIT PROCEDURE FLUENT (KITS) ×1 IMPLANT
KIT TURNOVER CYSTO (KITS) ×1 IMPLANT
LOOP CUTTING BIPOLAR 21FR (ELECTRODE) IMPLANT
MYOSURE XL FIBROID (MISCELLANEOUS) ×1
PACK VAGINAL MINOR WOMEN LF (CUSTOM PROCEDURE TRAY) ×1 IMPLANT
PAD OB MATERNITY 4.3X12.25 (PERSONAL CARE ITEMS) ×1 IMPLANT
PAD PREP 24X48 CUFFED NSTRL (MISCELLANEOUS) ×1 IMPLANT
SEAL CERVICAL OMNI LOK (ABLATOR) IMPLANT
SEAL ROD LENS SCOPE MYOSURE (ABLATOR) ×1 IMPLANT
SLEEVE SCD COMPRESS KNEE MED (STOCKING) ×1 IMPLANT
SYSTEM TISS REMOVAL MYOSURE XL (MISCELLANEOUS) IMPLANT
TOWEL OR 17X24 6PK STRL BLUE (TOWEL DISPOSABLE) ×1 IMPLANT
WATER STERILE IRR 500ML POUR (IV SOLUTION) ×1 IMPLANT

## 2023-06-18 NOTE — Anesthesia Procedure Notes (Signed)
Procedure Name: LMA Insertion Date/Time: 06/18/2023 12:16 PM  Performed by: Briant Sites, CRNAPre-anesthesia Checklist: Patient identified, Emergency Drugs available, Suction available and Patient being monitored Patient Re-evaluated:Patient Re-evaluated prior to induction Oxygen Delivery Method: Circle system utilized Preoxygenation: Pre-oxygenation with 100% oxygen Induction Type: IV induction Ventilation: Mask ventilation without difficulty LMA: LMA inserted LMA Size: 4.0 Number of attempts: 1 Airway Equipment and Method: Bite block Placement Confirmation: positive ETCO2 Tube secured with: Tape Dental Injury: Teeth and Oropharynx as per pre-operative assessment

## 2023-06-18 NOTE — Op Note (Signed)
Preoperative diagnosis: Menometrorrhagia with anemia, submucosal myoma Postoperative diagnosis: Same Procedure: Hysteroscopy with Myosure resection of myoma Surgeon: Lavina Hamman M.D. Anesthesia: Gen. With an LMA, paracervical block Findings: She had a normal endometrial cavity with a large submucosal myoma originating at about 11:00, moderated endometrial tissue, also had an endocervical polyp Estimated blood loss: Minimal Fluid deficit: Through the hysteroscope fluid deficit was about 340 cc Specimens: Endometrial resection and endocervical polyp sent for routine pathology Complications: None  Procedure in detail: The patient was taken to the operating room and placed in the dorsosupine position. General anesthesia was induced. She was placed in mobile stirrups and legs were elevated. Perineum and vagina were prepped and draped in the usual sterile fashion and bladder drained with a red Robinson catheter. A Graves speculum was inserted in the vagina and the anterior lip of the cervix was grasped with a single-tooth tenaculum. Paracervical block was performed with a total of 10 cc of 2% plain lidocaine. An endocervical polyp was removed with small ring forceps. The uterus then sounded to 10 cm. The cervix was gradually dilated to a size 23 dilator without difficulty. The Myosure hysteroscope was inserted and good visualization was achieved.  The submucossal myoma was easily identified.  The Myosure XL was inserted and the myoma was completely resected, as well as most of the endometrial tissue.  The endometrial cavity was now normal, no other visible lesions.  The hysteroscope was removed. The single-tooth tenaculum was removed from the cervix and bleeding was controlled with pressure. All instruments were then removed from the vagina. The patient tolerated the procedure well and was taken to the recovery in stable condition. Counts were correct and she had PAS hose on throughout the procedure.

## 2023-06-18 NOTE — Transfer of Care (Signed)
Immediate Anesthesia Transfer of Care Note  Patient: Kelly Joyce  Procedure(s) Performed: DILATATION & CURETTAGE/HYSTEROSCOPY WITH MYOSURE, RESECTION OF MYOMA (Uterus)  Patient Location: PACU  Anesthesia Type:General  Level of Consciousness: drowsy  Airway & Oxygen Therapy: Patient Spontanous Breathing and Patient connected to nasal cannula oxygen  Post-op Assessment: Report given to RN  Post vital signs: Reviewed and stable  Last Vitals:  Vitals Value Taken Time  BP 106/67 06/18/23 1301  Temp 37.3 C 06/18/23 1300  Pulse 70 06/18/23 1306  Resp 11 06/18/23 1306  SpO2 100 % 06/18/23 1306  Vitals shown include unfiled device data.  Last Pain:  Vitals:   06/18/23 1022  TempSrc:   PainSc: 0-No pain      Patients Stated Pain Goal: 7 (06/18/23 1022)  Complications: No notable events documented.

## 2023-06-18 NOTE — Interval H&P Note (Signed)
History and Physical Interval Note:  06/18/2023 11:56 AM  Kelly Joyce  has presented today for surgery, with the diagnosis of submucosal myoma with menorrhagia.  The various methods of treatment have been discussed with the patient and family. After consideration of risks, benefits and other options for treatment, the patient has consented to  Procedure(s): DILATATION & CURETTAGE/HYSTEROSCOPY WITH MYOSURE, RESECTION OF MYOMA (N/A) as a surgical intervention.  The patient's history has been reviewed, patient examined, no change in status, stable for surgery.  I have reviewed the patient's chart and labs.  Questions were answered to the patient's satisfaction.     Leighton Roach Nou Chard

## 2023-06-18 NOTE — Discharge Instructions (Addendum)
  Post Anesthesia Home Care Instructions  Activity: Get plenty of rest for the remainder of the day. A responsible individual must stay with you for 24 hours following the procedure.  For the next 24 hours, DO NOT: -Drive a car -Advertising copywriter -Drink alcoholic beverages -Take any medication unless instructed by your physician -Make any legal decisions or sign important papers.  Meals: Start with liquid foods such as gelatin or soup. Progress to regular foods as tolerated. Avoid greasy, spicy, heavy foods. If nausea and/or vomiting occur, drink only clear liquids until the nausea and/or vomiting subsides. Call your physician if vomiting continues.  Special Instructions/Symptoms: Your throat may feel dry or sore from the anesthesia or the breathing tube placed in your throat during surgery. If this causes discomfort, gargle with warm salt water. The discomfort should disappear within 24 hours.  If you had a scopolamine patch placed behind your ear for the management of post- operative nausea and/or vomiting:  1. The medication in the patch is effective for 72 hours, after which it should be removed.  Wrap patch in a tissue and discard in the trash. Wash hands thoroughly with soap and water. 2. You may remove the patch earlier than 72 hours if you experience unpleasant side effects which may include dry mouth, dizziness or visual disturbances. 3. Avoid touching the patch. Wash your hands with soap and water after contact with the patch.    Routine instructions for hysteroscopy.  OTC Ibuprofen and tylenol prn pain

## 2023-06-19 ENCOUNTER — Other Ambulatory Visit: Payer: Self-pay

## 2023-06-19 ENCOUNTER — Emergency Department (HOSPITAL_BASED_OUTPATIENT_CLINIC_OR_DEPARTMENT_OTHER)
Admission: EM | Admit: 2023-06-19 | Discharge: 2023-06-19 | Disposition: A | Discharge status: Home / Self Care | Payer: BC Managed Care – PPO | Attending: Emergency Medicine | Admitting: Emergency Medicine

## 2023-06-19 ENCOUNTER — Encounter (HOSPITAL_BASED_OUTPATIENT_CLINIC_OR_DEPARTMENT_OTHER): Payer: Self-pay | Admitting: Emergency Medicine

## 2023-06-19 DIAGNOSIS — R202 Paresthesia of skin: Secondary | ICD-10-CM | POA: Insufficient documentation

## 2023-06-19 DIAGNOSIS — R200 Anesthesia of skin: Secondary | ICD-10-CM | POA: Diagnosis present

## 2023-06-19 LAB — BASIC METABOLIC PANEL
Anion gap: 6 (ref 5–15)
BUN: 7 mg/dL (ref 6–20)
CO2: 23 mmol/L (ref 22–32)
Calcium: 9.3 mg/dL (ref 8.9–10.3)
Chloride: 108 mmol/L (ref 98–111)
Creatinine, Ser: 0.56 mg/dL (ref 0.44–1.00)
GFR, Estimated: >60 mL/min (ref >60)
Glucose, Bld: 111 mg/dL — ABNORMAL HIGH (ref 70–99)
Potassium: 4 mmol/L (ref 3.5–5.1)
Sodium: 137 mmol/L (ref 135–145)

## 2023-06-19 NOTE — Discharge Instructions (Signed)
You were seen today for numbness and/or paresthesias of your extremities.  The cause at this time is unknown.  It does not appear that you have had a stroke.  Make sure that you are staying hydrated.  Monitor your symptoms closely.  If symptoms recur or worsen, you should be reevaluated.

## 2023-06-19 NOTE — ED Triage Notes (Signed)
Pt reports she had a fibroid removed yesterday at [facility], now has numbness to bilateral arms and lower left leg x 10 mins ago; arms have been tingling a few hours

## 2023-06-19 NOTE — Anesthesia Postprocedure Evaluation (Signed)
Anesthesia Post Note  Patient: D'orndra Rulet Mackert  Procedure(s) Performed: DILATATION & CURETTAGE/HYSTEROSCOPY WITH MYOSURE, RESECTION OF MYOMA (Uterus)     Patient location during evaluation: PACU Anesthesia Type: General Level of consciousness: awake and alert, oriented and patient cooperative Pain management: pain level controlled Vital Signs Assessment: post-procedure vital signs reviewed and stable Respiratory status: spontaneous breathing, nonlabored ventilation and respiratory function stable Cardiovascular status: blood pressure returned to baseline and stable Postop Assessment: no apparent nausea or vomiting Anesthetic complications: no   No notable events documented.  Last Vitals:  Vitals:   06/18/23 1345 06/18/23 1418  BP: 107/64 103/71  Pulse: 63 72  Resp: 20 17  Temp:  36.7 C  SpO2: 94% 100%    Last Pain:  Vitals:   06/18/23 1418  TempSrc:   PainSc: 2                  Lannie Fields

## 2023-06-19 NOTE — ED Provider Notes (Signed)
[place] EMERGENCY DEPARTMENT AT [facility] Provider Note   CSN: [id number] Arrival date & time: 06/19/23  0115     History  Chief Complaint  Patient presents with   Numbness    Kelly Joyce is a 47 y.o. female.  HPI     This a 47 year old female who presents with numbness.  Patient had a hysteroscopy and resection of myoma less than 24 hours ago.  She states that the procedure lasted less than 1 hour.  She was doing well postoperatively.  She has only taken 1 dose of antibiotics postoperatively which she has been taking for the last week.  Patient states that she was watching TV tonight when she developed numbness in the bilateral forearm and left leg.  She describes it as heaviness and tingling.  Denies any significant position changes or nerve impingement.  Denies weakness or strokelike symptoms.  States that the sensation has resolved in her right forearm but persist in the left forearm and the left lateral calf.  She has been ambulatory.  Denies any new medications or foods.  Home Medications Prior to Admission medications   Medication Sig Start Date End Date Taking? Authorizing Provider  cephALEXin (KEFLEX) 500 MG capsule Take 1 capsule (500 mg total) by mouth 2 (two) times daily for 7 days. 06/13/23 06/20/23  Schutt, Edsel Petrin, PA-C  ferrous sulfate 325 (65 FE) MG tablet Take 1 tablet (325 mg total) by mouth daily. Patient taking differently: Take 325 mg by mouth every other day. 05/30/23   Karmen Stabs, MD  norethindrone (AYGESTIN) 5 MG tablet Take 5 mg by mouth daily.    [provider]      Allergies    Advil [ibuprofen], Biotin, Mirena [levonorgestrel], and Other    Review of Systems   Review of Systems  Constitutional:  Negative for fever.  Neurological:  Positive for numbness. Negative for weakness and headaches.  All other systems reviewed and are negative.   Physical Exam Updated Vital Signs BP 132/72 (BP Location: Right Arm)    Pulse 89   Temp 98.5 F (36.9 C) (Oral)   Resp 16   Ht 1.651 m (5\' 5" )   Wt 75.8 kg   LMP 05/29/2023 (Approximate)   SpO2 100%   BMI 27.79 kg/m  Physical Exam Vitals and nursing note reviewed.  Constitutional:      Appearance: She is well-developed. She is not ill-appearing.  HENT:     Head: Normocephalic and atraumatic.  Eyes:     Pupils: Pupils are equal, round, and reactive to light.  Cardiovascular:     Rate and Rhythm: Normal rate and regular rhythm.     Heart sounds: Normal heart sounds.  Pulmonary:     Effort: Pulmonary effort is normal. No respiratory distress.     Breath sounds: No wheezing.  Abdominal:     Palpations: Abdomen is soft.     Tenderness: There is no abdominal tenderness.  Musculoskeletal:     Cervical back: Neck supple.  Skin:    General: Skin is warm and dry.  Neurological:     Mental Status: She is alert and oriented to person, place, and time.     Comments: Cranial nerves II through XII intact, fluent speech, 5 out of 5 strength in all 4 extremities, no dysmetria to finger-nose-finger, sensation grossly intact  Psychiatric:        Mood and Affect: Mood normal.     ED Results / Procedures / Treatments  Labs (all labs ordered are listed, but only abnormal results are displayed) Labs Reviewed  BASIC METABOLIC PANEL - Abnormal; Notable for the following components:      Result Value   Glucose, Bld 111 (*)    All other components within normal limits    EKG None  Radiology No results found.  Procedures Procedures    Medications Ordered in ED Medications - No data to display  ED Course/ Medical Decision Making/ A&P                                 Medical Decision Making Amount and/or Complexity of Data Reviewed Labs: ordered.   This patient presents to the ED for concern of numbness, this involves an extensive number of treatment options, and is a complaint that carries with it a high risk of complications and morbidity.   I considered the following differential and admission for this acute, potentially life threatening condition.  The differential diagnosis includes paresthesia, nerve impingement, metabolic derangement, less likely stroke given bilateral nature  MDM:    This is a 47 year old female who presents less than 1 day following anesthesia for myomectomy who presents with numbness.  She is nontoxic and vital signs are reassuring.  Numbness was initially bilateral forearms and left leg.  Now mostly on the left.  Objectively, her neurologic exam is normal.  It is patchy numbness involving the forearm and the left lateral calf.  Her sensation is grossly intact.  I have very low suspicion for stroke.  Metabolic derangements or nerve impingement is a consideration although she did not have a prolonged procedure.  BMP is normal.  Could also be medication related.  On recheck, she states that she is gradually improving.  Recommend that she monitor her symptoms closely.  Doubt acute emergent process.  (Labs, imaging, consults)  Labs: I Ordered, and personally interpreted labs.  The pertinent results include: BMP  Imaging Studies ordered: I ordered imaging studies including none I independently visualized and interpreted imaging. I agree with the radiologist interpretation  Additional history obtained from chart review.  External records from outside source obtained and reviewed including operative records  Cardiac Monitoring: The patient was not maintained on a cardiac monitor.  If on the cardiac monitor, I personally viewed and interpreted the cardiac monitored which showed an underlying rhythm of: I N/A  Reevaluation: After the interventions noted above, I reevaluated the patient and found that they have :resolved  Social Determinants of Health:  lives independently  Disposition: Discharge  Co morbidities that complicate the patient evaluation  Past Medical History:  Diagnosis Date   Anemia     Anxiety    Depression      Medicines No orders of the defined types were placed in this encounter.   I have reviewed the patients home medicines and have made adjustments as needed  Problem List / ED Course: Problem List Items Addressed This Visit   None Visit Diagnoses     Paresthesia    -  Primary                   Final Clinical Impression(s) / ED Diagnoses Final diagnoses:  Paresthesia    Rx / DC Orders ED Discharge Orders     None         Shon Baton, MD 06/19/23 [id number]

## 2023-06-22 ENCOUNTER — Encounter (HOSPITAL_BASED_OUTPATIENT_CLINIC_OR_DEPARTMENT_OTHER): Payer: Self-pay | Admitting: Obstetrics and Gynecology

## 2023-06-23 LAB — SURGICAL PATHOLOGY

## 2023-09-02 ENCOUNTER — Other Ambulatory Visit: Payer: Self-pay | Admitting: Family Medicine

## 2023-09-02 DIAGNOSIS — Z1231 Encounter for screening mammogram for malignant neoplasm of breast: Secondary | ICD-10-CM

## 2023-09-10 ENCOUNTER — Ambulatory Visit: Payer: Self-pay

## 2023-09-17 ENCOUNTER — Ambulatory Visit: Payer: 59

## 2023-10-01 ENCOUNTER — Encounter: Payer: Self-pay | Admitting: Radiology

## 2023-10-01 ENCOUNTER — Ambulatory Visit
Admission: RE | Admit: 2023-10-01 | Discharge: 2023-10-01 | Disposition: A | Discharge status: Home / Self Care | Source: Ambulatory Visit | Attending: Family Medicine | Admitting: Family Medicine

## 2023-10-01 DIAGNOSIS — Z1231 Encounter for screening mammogram for malignant neoplasm of breast: Secondary | ICD-10-CM
